# Patient Record
Sex: Female | Born: 1950 | Race: White | Hispanic: No | Smoking: Former smoker
Health system: Southern US, Community
[De-identification: ages and names within clinical notes are randomized; demographics above are authoritative.]

## PROBLEM LIST (undated history)

## (undated) DIAGNOSIS — R0602 Shortness of breath: Secondary | ICD-10-CM

## (undated) DIAGNOSIS — E7800 Pure hypercholesterolemia, unspecified: Secondary | ICD-10-CM

## (undated) DIAGNOSIS — C801 Malignant (primary) neoplasm, unspecified: Secondary | ICD-10-CM

## (undated) DIAGNOSIS — M2550 Pain in unspecified joint: Secondary | ICD-10-CM

## (undated) DIAGNOSIS — K219 Gastro-esophageal reflux disease without esophagitis: Secondary | ICD-10-CM

## (undated) DIAGNOSIS — R600 Localized edema: Secondary | ICD-10-CM

## (undated) DIAGNOSIS — I10 Essential (primary) hypertension: Secondary | ICD-10-CM

## (undated) DIAGNOSIS — R7303 Prediabetes: Secondary | ICD-10-CM

## (undated) DIAGNOSIS — E669 Obesity, unspecified: Secondary | ICD-10-CM

## (undated) DIAGNOSIS — E559 Vitamin D deficiency, unspecified: Secondary | ICD-10-CM

## (undated) HISTORY — PX: OTHER SURGICAL HISTORY: SHX169

## (undated) HISTORY — PX: BREAST BIOPSY: SHX20

## (undated) HISTORY — DX: Vitamin D deficiency, unspecified: E55.9

## (undated) HISTORY — DX: Essential (primary) hypertension: I10

## (undated) HISTORY — DX: Shortness of breath: R06.02

## (undated) HISTORY — DX: Localized edema: R60.0

## (undated) HISTORY — DX: Prediabetes: R73.03

## (undated) HISTORY — DX: Obesity, unspecified: E66.9

## (undated) HISTORY — DX: Pure hypercholesterolemia, unspecified: E78.00

## (undated) HISTORY — DX: Pain in unspecified joint: M25.50

---

## 1981-10-24 HISTORY — PX: TUBAL LIGATION: SHX77

## 2014-11-28 ENCOUNTER — Ambulatory Visit
Admission: RE | Admit: 2014-11-28 | Discharge: 2014-11-28 | Disposition: A | Discharge status: Home / Self Care | Payer: Medicare Other | Source: Ambulatory Visit | Attending: Physician Assistant | Admitting: Physician Assistant

## 2014-11-28 ENCOUNTER — Other Ambulatory Visit: Payer: Self-pay | Admitting: Physician Assistant

## 2014-11-28 DIAGNOSIS — M79672 Pain in left foot: Principal | ICD-10-CM

## 2014-11-28 DIAGNOSIS — G8929 Other chronic pain: Secondary | ICD-10-CM

## 2014-11-28 DIAGNOSIS — M79675 Pain in left toe(s): Secondary | ICD-10-CM | POA: Diagnosis not present

## 2015-03-04 DIAGNOSIS — M79672 Pain in left foot: Secondary | ICD-10-CM | POA: Diagnosis not present

## 2015-03-04 DIAGNOSIS — Z79899 Other long term (current) drug therapy: Secondary | ICD-10-CM | POA: Diagnosis not present

## 2015-03-04 DIAGNOSIS — M25569 Pain in unspecified knee: Secondary | ICD-10-CM | POA: Diagnosis not present

## 2015-03-04 DIAGNOSIS — M79673 Pain in unspecified foot: Secondary | ICD-10-CM | POA: Diagnosis not present

## 2015-03-04 DIAGNOSIS — M25561 Pain in right knee: Secondary | ICD-10-CM | POA: Diagnosis not present

## 2015-03-04 DIAGNOSIS — L608 Other nail disorders: Secondary | ICD-10-CM | POA: Diagnosis not present

## 2015-04-01 DIAGNOSIS — H66002 Acute suppurative otitis media without spontaneous rupture of ear drum, left ear: Secondary | ICD-10-CM | POA: Diagnosis not present

## 2015-05-05 DIAGNOSIS — Z23 Encounter for immunization: Secondary | ICD-10-CM | POA: Diagnosis not present

## 2015-05-05 DIAGNOSIS — Z0000 Encounter for general adult medical examination without abnormal findings: Secondary | ICD-10-CM | POA: Diagnosis not present

## 2015-05-05 DIAGNOSIS — Z136 Encounter for screening for cardiovascular disorders: Secondary | ICD-10-CM | POA: Diagnosis not present

## 2015-05-08 ENCOUNTER — Other Ambulatory Visit: Payer: Self-pay | Admitting: Family Medicine

## 2015-06-03 ENCOUNTER — Other Ambulatory Visit: Payer: Self-pay

## 2015-06-03 DIAGNOSIS — Z1231 Encounter for screening mammogram for malignant neoplasm of breast: Secondary | ICD-10-CM

## 2015-06-10 ENCOUNTER — Ambulatory Visit
Admission: RE | Admit: 2015-06-10 | Discharge: 2015-06-10 | Disposition: A | Discharge status: Home / Self Care | Payer: Medicare Other | Source: Ambulatory Visit

## 2015-06-10 DIAGNOSIS — Z1231 Encounter for screening mammogram for malignant neoplasm of breast: Secondary | ICD-10-CM | POA: Diagnosis not present

## 2015-06-12 DIAGNOSIS — R262 Difficulty in walking, not elsewhere classified: Secondary | ICD-10-CM | POA: Diagnosis not present

## 2015-06-12 DIAGNOSIS — M170 Bilateral primary osteoarthritis of knee: Secondary | ICD-10-CM | POA: Diagnosis not present

## 2015-06-15 DIAGNOSIS — K219 Gastro-esophageal reflux disease without esophagitis: Secondary | ICD-10-CM | POA: Diagnosis not present

## 2015-06-15 DIAGNOSIS — R1310 Dysphagia, unspecified: Secondary | ICD-10-CM | POA: Diagnosis not present

## 2015-06-15 DIAGNOSIS — Z86010 Personal history of colonic polyps: Secondary | ICD-10-CM | POA: Diagnosis not present

## 2015-06-15 DIAGNOSIS — K649 Unspecified hemorrhoids: Secondary | ICD-10-CM | POA: Diagnosis not present

## 2015-06-16 DIAGNOSIS — M722 Plantar fascial fibromatosis: Secondary | ICD-10-CM | POA: Diagnosis not present

## 2015-06-16 DIAGNOSIS — M79672 Pain in left foot: Secondary | ICD-10-CM | POA: Diagnosis not present

## 2015-06-16 DIAGNOSIS — M79671 Pain in right foot: Secondary | ICD-10-CM | POA: Diagnosis not present

## 2015-07-02 DIAGNOSIS — K115 Sialolithiasis: Secondary | ICD-10-CM | POA: Diagnosis not present

## 2015-07-07 DIAGNOSIS — M25572 Pain in left ankle and joints of left foot: Secondary | ICD-10-CM | POA: Diagnosis not present

## 2015-07-07 DIAGNOSIS — S40019A Contusion of unspecified shoulder, initial encounter: Secondary | ICD-10-CM | POA: Diagnosis not present

## 2015-07-07 DIAGNOSIS — W1830XA Fall on same level, unspecified, initial encounter: Secondary | ICD-10-CM | POA: Diagnosis not present

## 2015-07-07 DIAGNOSIS — M25511 Pain in right shoulder: Secondary | ICD-10-CM | POA: Diagnosis not present

## 2015-07-13 DIAGNOSIS — Z09 Encounter for follow-up examination after completed treatment for conditions other than malignant neoplasm: Secondary | ICD-10-CM | POA: Diagnosis not present

## 2015-07-13 DIAGNOSIS — K644 Residual hemorrhoidal skin tags: Secondary | ICD-10-CM | POA: Diagnosis not present

## 2015-07-13 DIAGNOSIS — K449 Diaphragmatic hernia without obstruction or gangrene: Secondary | ICD-10-CM | POA: Diagnosis not present

## 2015-07-13 DIAGNOSIS — K317 Polyp of stomach and duodenum: Secondary | ICD-10-CM | POA: Diagnosis not present

## 2015-07-13 DIAGNOSIS — R12 Heartburn: Secondary | ICD-10-CM | POA: Diagnosis not present

## 2015-07-13 DIAGNOSIS — R1310 Dysphagia, unspecified: Secondary | ICD-10-CM | POA: Diagnosis not present

## 2015-07-13 DIAGNOSIS — Z86010 Personal history of colonic polyps: Secondary | ICD-10-CM | POA: Diagnosis not present

## 2015-07-13 DIAGNOSIS — K5730 Diverticulosis of large intestine without perforation or abscess without bleeding: Secondary | ICD-10-CM | POA: Diagnosis not present

## 2015-07-13 DIAGNOSIS — K648 Other hemorrhoids: Secondary | ICD-10-CM | POA: Diagnosis not present

## 2015-07-14 DIAGNOSIS — S46011A Strain of muscle(s) and tendon(s) of the rotator cuff of right shoulder, initial encounter: Secondary | ICD-10-CM | POA: Diagnosis not present

## 2015-08-04 DIAGNOSIS — R6884 Jaw pain: Secondary | ICD-10-CM | POA: Diagnosis not present

## 2015-08-04 DIAGNOSIS — K115 Sialolithiasis: Secondary | ICD-10-CM | POA: Diagnosis not present

## 2015-08-23 DIAGNOSIS — Z23 Encounter for immunization: Secondary | ICD-10-CM | POA: Diagnosis not present

## 2015-09-17 DIAGNOSIS — R030 Elevated blood-pressure reading, without diagnosis of hypertension: Secondary | ICD-10-CM | POA: Diagnosis not present

## 2015-09-17 DIAGNOSIS — J358 Other chronic diseases of tonsils and adenoids: Secondary | ICD-10-CM | POA: Diagnosis not present

## 2015-09-17 DIAGNOSIS — J069 Acute upper respiratory infection, unspecified: Secondary | ICD-10-CM | POA: Diagnosis not present

## 2015-09-17 DIAGNOSIS — J029 Acute pharyngitis, unspecified: Secondary | ICD-10-CM | POA: Diagnosis not present

## 2015-09-28 ENCOUNTER — Other Ambulatory Visit: Payer: Self-pay | Admitting: Sports Medicine

## 2015-09-28 DIAGNOSIS — S46011D Strain of muscle(s) and tendon(s) of the rotator cuff of right shoulder, subsequent encounter: Secondary | ICD-10-CM | POA: Diagnosis not present

## 2015-09-28 DIAGNOSIS — M25511 Pain in right shoulder: Secondary | ICD-10-CM

## 2015-10-05 ENCOUNTER — Ambulatory Visit
Admission: RE | Admit: 2015-10-05 | Discharge: 2015-10-05 | Disposition: A | Discharge status: Home / Self Care | Payer: Medicare Other | Source: Ambulatory Visit | Attending: Sports Medicine | Admitting: Sports Medicine

## 2015-10-05 DIAGNOSIS — M25511 Pain in right shoulder: Secondary | ICD-10-CM | POA: Diagnosis not present

## 2015-10-07 DIAGNOSIS — S46011D Strain of muscle(s) and tendon(s) of the rotator cuff of right shoulder, subsequent encounter: Secondary | ICD-10-CM | POA: Diagnosis not present

## 2015-10-20 DIAGNOSIS — M25511 Pain in right shoulder: Secondary | ICD-10-CM | POA: Diagnosis not present

## 2015-10-20 DIAGNOSIS — M75111 Incomplete rotator cuff tear or rupture of right shoulder, not specified as traumatic: Secondary | ICD-10-CM | POA: Diagnosis not present

## 2015-10-20 DIAGNOSIS — M25611 Stiffness of right shoulder, not elsewhere classified: Secondary | ICD-10-CM | POA: Diagnosis not present

## 2015-10-21 DIAGNOSIS — M25611 Stiffness of right shoulder, not elsewhere classified: Secondary | ICD-10-CM | POA: Diagnosis not present

## 2015-10-21 DIAGNOSIS — M75111 Incomplete rotator cuff tear or rupture of right shoulder, not specified as traumatic: Secondary | ICD-10-CM | POA: Diagnosis not present

## 2015-10-21 DIAGNOSIS — M25511 Pain in right shoulder: Secondary | ICD-10-CM | POA: Diagnosis not present

## 2015-10-29 DIAGNOSIS — M25611 Stiffness of right shoulder, not elsewhere classified: Secondary | ICD-10-CM | POA: Diagnosis not present

## 2015-10-29 DIAGNOSIS — M25511 Pain in right shoulder: Secondary | ICD-10-CM | POA: Diagnosis not present

## 2015-10-29 DIAGNOSIS — M75111 Incomplete rotator cuff tear or rupture of right shoulder, not specified as traumatic: Secondary | ICD-10-CM | POA: Diagnosis not present

## 2015-11-05 DIAGNOSIS — M75111 Incomplete rotator cuff tear or rupture of right shoulder, not specified as traumatic: Secondary | ICD-10-CM | POA: Diagnosis not present

## 2015-11-05 DIAGNOSIS — M25611 Stiffness of right shoulder, not elsewhere classified: Secondary | ICD-10-CM | POA: Diagnosis not present

## 2015-11-05 DIAGNOSIS — M25511 Pain in right shoulder: Secondary | ICD-10-CM | POA: Diagnosis not present

## 2015-12-08 DIAGNOSIS — S9031XA Contusion of right foot, initial encounter: Secondary | ICD-10-CM | POA: Diagnosis not present

## 2015-12-08 DIAGNOSIS — M1711 Unilateral primary osteoarthritis, right knee: Secondary | ICD-10-CM | POA: Diagnosis not present

## 2015-12-14 ENCOUNTER — Encounter: Payer: Self-pay | Admitting: Podiatry

## 2015-12-14 ENCOUNTER — Ambulatory Visit (INDEPENDENT_AMBULATORY_CARE_PROVIDER_SITE_OTHER): Payer: Medicare Other | Admitting: Podiatry

## 2015-12-14 ENCOUNTER — Ambulatory Visit (INDEPENDENT_AMBULATORY_CARE_PROVIDER_SITE_OTHER): Payer: Medicare Other

## 2015-12-14 ENCOUNTER — Ambulatory Visit: Payer: Medicare Other

## 2015-12-14 VITALS — BP 130/78 | HR 78 | Resp 16 | Ht 63.0 in | Wt 185.0 lb

## 2015-12-14 DIAGNOSIS — M79672 Pain in left foot: Secondary | ICD-10-CM | POA: Diagnosis not present

## 2015-12-14 DIAGNOSIS — M722 Plantar fascial fibromatosis: Secondary | ICD-10-CM

## 2015-12-14 DIAGNOSIS — M779 Enthesopathy, unspecified: Secondary | ICD-10-CM | POA: Diagnosis not present

## 2015-12-14 MED ORDER — TRIAMCINOLONE ACETONIDE 10 MG/ML IJ SUSP
10.0000 mg | Freq: Once | INTRAMUSCULAR | Status: AC
Start: 2015-12-14 — End: 2015-12-14
  Administered 2015-12-14: 10 mg

## 2015-12-14 NOTE — Patient Instructions (Signed)

## 2015-12-14 NOTE — Progress Notes (Signed)
   Subjective:    Patient ID: Katrina Evans, female    DOB: [birth year], 65 y.o.   MRN: [id number]  HPI Patient presents with bilateral foot pain. Left foot; heel; Right foot-plantar forefoot & dorsal; pt stated, "Dropped a hand weight on top of foot a month ago".  Pt also stated, "Meloxicam is helping with their foot pain".   Review of Systems  HENT: Positive for sinus pressure.   All other systems reviewed and are negative.      Objective:   Physical Exam        Assessment & Plan:

## 2015-12-15 NOTE — Progress Notes (Signed)
Subjective:     Patient ID: Katrina Evans, female   DOB: 1950-11-21, 65 y.o.   MRN: [id number]  HPI patient states she's had pain in her left heel for around a year and a half and has also developed some discomfort in the right forefoot where she had some trauma secondary to dropping a weight on this area   Review of Systems  All other systems reviewed and are negative.      Objective:   Physical Exam  Constitutional: She is oriented to person, place, and time.  Cardiovascular: Intact distal pulses.   Musculoskeletal: Normal range of motion.  Neurological: She is oriented to person, place, and time.  Skin: Skin is warm.  Nursing note and vitals reviewed.  neurovascular status found to be intact with muscle strength adequate range of motion within normal limits with patient noted to have exquisite discomfort plantar aspect left heel at insertion and moderate forefoot pain right foot. Patient's found to have good digital perfusion and is well oriented 3     Assessment:     Acute plantar fasciitis left with forefoot trauma and possible fracture secondary to trauma    Plan:     H&P and x-rays of both feet reviewed. Today I went ahead and injected the left plantar fascia 3 mg Kenalog 5 mill grams Xylocaine and advised on physical therapy and dispensed fascial brace. The right foot ice therapy will be utilized  X-ray report indicated that there is no sign of fracture of the right forefoot and the left heel shows small spur with no indication of stress fracture

## 2015-12-21 ENCOUNTER — Encounter: Payer: Self-pay | Admitting: Podiatry

## 2015-12-21 ENCOUNTER — Ambulatory Visit (INDEPENDENT_AMBULATORY_CARE_PROVIDER_SITE_OTHER): Payer: Medicare Other | Admitting: Podiatry

## 2015-12-21 DIAGNOSIS — M722 Plantar fascial fibromatosis: Secondary | ICD-10-CM | POA: Diagnosis not present

## 2015-12-21 DIAGNOSIS — M779 Enthesopathy, unspecified: Secondary | ICD-10-CM

## 2015-12-21 DIAGNOSIS — M79672 Pain in left foot: Secondary | ICD-10-CM | POA: Diagnosis not present

## 2015-12-22 NOTE — Progress Notes (Signed)
Subjective:     Patient ID: Katrina Evans, female   DOB: 1951-01-16, 65 y.o.   MRN: [id number]  HPI patient states my feet feel much better but I do like to be active   Review of Systems     Objective:   Physical Exam Neurovascular status intact muscle strength adequate patient's found to have discomfort in the plantar aspect of the left heel that's improved but still present with depression of the arch noted    Assessment:     Plantar fasciitis left with inflammation fluid buildup with depression of the arch noted    Plan:     Physical therapy and shoe gear modifications recommended and scanned for custom orthotics to reduce all pressure on the plantar arch

## 2016-01-12 ENCOUNTER — Ambulatory Visit: Payer: Medicare Other | Admitting: *Deleted

## 2016-01-12 DIAGNOSIS — M722 Plantar fascial fibromatosis: Secondary | ICD-10-CM

## 2016-01-12 NOTE — Patient Instructions (Signed)

## 2016-01-12 NOTE — Progress Notes (Signed)
Patient ID: Katrina Evans, female   DOB: 1951-02-27, 65 y.o.   MRN: [id number] Patient presents for orthotic pick up.  Verbal and written break in and wear instructions given.  Patient will follow up in 4 weeks if symptoms worsen or fail to improve.

## 2016-01-13 DIAGNOSIS — M25562 Pain in left knee: Secondary | ICD-10-CM | POA: Diagnosis not present

## 2016-01-13 DIAGNOSIS — M170 Bilateral primary osteoarthritis of knee: Secondary | ICD-10-CM | POA: Diagnosis not present

## 2016-01-13 DIAGNOSIS — M25561 Pain in right knee: Secondary | ICD-10-CM | POA: Diagnosis not present

## 2016-01-13 DIAGNOSIS — R262 Difficulty in walking, not elsewhere classified: Secondary | ICD-10-CM | POA: Diagnosis not present

## 2016-01-19 DIAGNOSIS — M1712 Unilateral primary osteoarthritis, left knee: Secondary | ICD-10-CM | POA: Diagnosis not present

## 2016-01-19 DIAGNOSIS — M170 Bilateral primary osteoarthritis of knee: Secondary | ICD-10-CM | POA: Diagnosis not present

## 2016-01-19 DIAGNOSIS — M25562 Pain in left knee: Secondary | ICD-10-CM | POA: Diagnosis not present

## 2016-01-21 DIAGNOSIS — M25561 Pain in right knee: Secondary | ICD-10-CM | POA: Diagnosis not present

## 2016-01-21 DIAGNOSIS — M1711 Unilateral primary osteoarthritis, right knee: Secondary | ICD-10-CM | POA: Diagnosis not present

## 2016-01-26 DIAGNOSIS — M25562 Pain in left knee: Secondary | ICD-10-CM | POA: Diagnosis not present

## 2016-01-26 DIAGNOSIS — M1712 Unilateral primary osteoarthritis, left knee: Secondary | ICD-10-CM | POA: Diagnosis not present

## 2016-01-28 DIAGNOSIS — M25561 Pain in right knee: Secondary | ICD-10-CM | POA: Diagnosis not present

## 2016-01-28 DIAGNOSIS — M1711 Unilateral primary osteoarthritis, right knee: Secondary | ICD-10-CM | POA: Diagnosis not present

## 2016-01-29 DIAGNOSIS — L720 Epidermal cyst: Secondary | ICD-10-CM | POA: Diagnosis not present

## 2016-01-29 DIAGNOSIS — L821 Other seborrheic keratosis: Secondary | ICD-10-CM | POA: Diagnosis not present

## 2016-01-29 DIAGNOSIS — D485 Neoplasm of uncertain behavior of skin: Secondary | ICD-10-CM | POA: Diagnosis not present

## 2016-01-29 DIAGNOSIS — D1801 Hemangioma of skin and subcutaneous tissue: Secondary | ICD-10-CM | POA: Diagnosis not present

## 2016-01-29 DIAGNOSIS — L814 Other melanin hyperpigmentation: Secondary | ICD-10-CM | POA: Diagnosis not present

## 2016-01-29 DIAGNOSIS — L301 Dyshidrosis [pompholyx]: Secondary | ICD-10-CM | POA: Diagnosis not present

## 2016-01-29 DIAGNOSIS — D2239 Melanocytic nevi of other parts of face: Secondary | ICD-10-CM | POA: Diagnosis not present

## 2016-02-01 DIAGNOSIS — M170 Bilateral primary osteoarthritis of knee: Secondary | ICD-10-CM | POA: Diagnosis not present

## 2016-02-01 DIAGNOSIS — M25562 Pain in left knee: Secondary | ICD-10-CM | POA: Diagnosis not present

## 2016-02-01 DIAGNOSIS — R2689 Other abnormalities of gait and mobility: Secondary | ICD-10-CM | POA: Diagnosis not present

## 2016-02-01 DIAGNOSIS — M25561 Pain in right knee: Secondary | ICD-10-CM | POA: Diagnosis not present

## 2016-02-02 DIAGNOSIS — M1712 Unilateral primary osteoarthritis, left knee: Secondary | ICD-10-CM | POA: Diagnosis not present

## 2016-02-02 DIAGNOSIS — M25562 Pain in left knee: Secondary | ICD-10-CM | POA: Diagnosis not present

## 2016-02-04 DIAGNOSIS — M25561 Pain in right knee: Secondary | ICD-10-CM | POA: Diagnosis not present

## 2016-02-04 DIAGNOSIS — M1711 Unilateral primary osteoarthritis, right knee: Secondary | ICD-10-CM | POA: Diagnosis not present

## 2016-02-09 DIAGNOSIS — M170 Bilateral primary osteoarthritis of knee: Secondary | ICD-10-CM | POA: Diagnosis not present

## 2016-02-09 DIAGNOSIS — M25561 Pain in right knee: Secondary | ICD-10-CM | POA: Diagnosis not present

## 2016-02-09 DIAGNOSIS — M25562 Pain in left knee: Secondary | ICD-10-CM | POA: Diagnosis not present

## 2016-02-18 ENCOUNTER — Telehealth: Payer: Self-pay | Admitting: *Deleted

## 2016-02-18 NOTE — Telephone Encounter (Signed)
I received a call from Katrina Evans this morning thanking me for pointing out a bruise I saw on the bottom of her foot during her orthotic fitting.  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  She stated if I had not mentioned it she would have never known it was there.  She states that after its removal she should be just fine and just wanted to thank me.

## 2016-02-24 DIAGNOSIS — D0371 Melanoma in situ of right lower limb, including hip: Secondary | ICD-10-CM | POA: Diagnosis not present

## 2016-03-24 DIAGNOSIS — R030 Elevated blood-pressure reading, without diagnosis of hypertension: Secondary | ICD-10-CM | POA: Diagnosis not present

## 2016-03-24 DIAGNOSIS — J358 Other chronic diseases of tonsils and adenoids: Secondary | ICD-10-CM | POA: Diagnosis not present

## 2016-05-06 DIAGNOSIS — Z131 Encounter for screening for diabetes mellitus: Secondary | ICD-10-CM | POA: Diagnosis not present

## 2016-05-06 DIAGNOSIS — W19XXXA Unspecified fall, initial encounter: Secondary | ICD-10-CM | POA: Diagnosis not present

## 2016-05-06 DIAGNOSIS — Z6834 Body mass index (BMI) 34.0-34.9, adult: Secondary | ICD-10-CM | POA: Diagnosis not present

## 2016-05-06 DIAGNOSIS — R8290 Unspecified abnormal findings in urine: Secondary | ICD-10-CM | POA: Diagnosis not present

## 2016-05-06 DIAGNOSIS — Z0000 Encounter for general adult medical examination without abnormal findings: Secondary | ICD-10-CM | POA: Diagnosis not present

## 2016-05-06 DIAGNOSIS — E7800 Pure hypercholesterolemia, unspecified: Secondary | ICD-10-CM | POA: Diagnosis not present

## 2016-05-09 ENCOUNTER — Other Ambulatory Visit: Payer: Self-pay | Admitting: Physician Assistant

## 2016-05-09 DIAGNOSIS — Z139 Encounter for screening, unspecified: Secondary | ICD-10-CM

## 2016-05-11 DIAGNOSIS — M8588 Other specified disorders of bone density and structure, other site: Secondary | ICD-10-CM | POA: Diagnosis not present

## 2016-05-11 DIAGNOSIS — M8589 Other specified disorders of bone density and structure, multiple sites: Secondary | ICD-10-CM | POA: Diagnosis not present

## 2016-06-01 DIAGNOSIS — H2513 Age-related nuclear cataract, bilateral: Secondary | ICD-10-CM | POA: Diagnosis not present

## 2016-06-13 ENCOUNTER — Ambulatory Visit: Payer: Medicare Other

## 2016-06-13 DIAGNOSIS — L438 Other lichen planus: Secondary | ICD-10-CM | POA: Diagnosis not present

## 2016-06-13 DIAGNOSIS — D1801 Hemangioma of skin and subcutaneous tissue: Secondary | ICD-10-CM | POA: Diagnosis not present

## 2016-06-13 DIAGNOSIS — L821 Other seborrheic keratosis: Secondary | ICD-10-CM | POA: Diagnosis not present

## 2016-06-13 DIAGNOSIS — L814 Other melanin hyperpigmentation: Secondary | ICD-10-CM | POA: Diagnosis not present

## 2016-06-13 DIAGNOSIS — D225 Melanocytic nevi of trunk: Secondary | ICD-10-CM | POA: Diagnosis not present

## 2016-06-21 ENCOUNTER — Ambulatory Visit
Admission: RE | Admit: 2016-06-21 | Discharge: 2016-06-21 | Disposition: A | Discharge status: Home / Self Care | Payer: Medicare Other | Source: Ambulatory Visit | Attending: Physician Assistant | Admitting: Physician Assistant

## 2016-06-21 DIAGNOSIS — Z139 Encounter for screening, unspecified: Secondary | ICD-10-CM

## 2016-06-21 DIAGNOSIS — Z1231 Encounter for screening mammogram for malignant neoplasm of breast: Secondary | ICD-10-CM | POA: Diagnosis not present

## 2016-08-09 DIAGNOSIS — Z23 Encounter for immunization: Secondary | ICD-10-CM | POA: Diagnosis not present

## 2016-08-30 DIAGNOSIS — M25562 Pain in left knee: Secondary | ICD-10-CM | POA: Diagnosis not present

## 2016-08-30 DIAGNOSIS — M170 Bilateral primary osteoarthritis of knee: Secondary | ICD-10-CM | POA: Diagnosis not present

## 2016-08-30 DIAGNOSIS — M25561 Pain in right knee: Secondary | ICD-10-CM | POA: Diagnosis not present

## 2016-09-02 ENCOUNTER — Encounter: Payer: Self-pay | Admitting: Podiatry

## 2016-09-02 ENCOUNTER — Ambulatory Visit (INDEPENDENT_AMBULATORY_CARE_PROVIDER_SITE_OTHER): Payer: Medicare Other

## 2016-09-02 ENCOUNTER — Ambulatory Visit (INDEPENDENT_AMBULATORY_CARE_PROVIDER_SITE_OTHER): Payer: Medicare Other | Admitting: Podiatry

## 2016-09-02 VITALS — BP 144/86 | HR 71 | Resp 16

## 2016-09-02 DIAGNOSIS — M722 Plantar fascial fibromatosis: Secondary | ICD-10-CM

## 2016-09-02 DIAGNOSIS — M79671 Pain in right foot: Secondary | ICD-10-CM

## 2016-09-02 DIAGNOSIS — M779 Enthesopathy, unspecified: Secondary | ICD-10-CM

## 2016-09-02 MED ORDER — TRIAMCINOLONE ACETONIDE 10 MG/ML IJ SUSP
10.0000 mg | Freq: Once | INTRAMUSCULAR | Status: AC
  Administered 2016-09-02: 10 mg

## 2016-09-02 NOTE — Patient Instructions (Signed)

## 2016-09-02 NOTE — Progress Notes (Signed)
Subjective:     Patient ID: Katrina Evans, female   DOB: 13-May-1951, 65 y.o.   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 and they put a rapid on ever since her forefoot has been hurting her   Review of Systems     Objective:   Physical Exam Neurovascular status intact muscle strength adequate with inflammation in the subsecond metatarsal right with fluid buildup within the joint surface    Assessment:     Probable inflammatory capsulitis second MPJ right with pain    Plan:     H&P condition reviewed and went ahead did proximal nerve block aspirated the joint getting out a small amount of clear fluid and injected with a quarter cc deck Smith some Kenalog and applied thick plantar pad to take pressure off the joint surface. Reappoint 3 weeks or earlier if needed and will not go barefoot at home

## 2016-09-23 ENCOUNTER — Ambulatory Visit: Payer: Medicare Other | Admitting: Podiatry

## 2016-11-04 DIAGNOSIS — J208 Acute bronchitis due to other specified organisms: Secondary | ICD-10-CM | POA: Diagnosis not present

## 2016-11-04 DIAGNOSIS — R69 Illness, unspecified: Secondary | ICD-10-CM | POA: Diagnosis not present

## 2016-11-04 DIAGNOSIS — R509 Fever, unspecified: Secondary | ICD-10-CM | POA: Diagnosis not present

## 2016-11-04 DIAGNOSIS — R05 Cough: Secondary | ICD-10-CM | POA: Diagnosis not present

## 2016-12-19 DIAGNOSIS — L821 Other seborrheic keratosis: Secondary | ICD-10-CM | POA: Diagnosis not present

## 2016-12-19 DIAGNOSIS — I788 Other diseases of capillaries: Secondary | ICD-10-CM | POA: Diagnosis not present

## 2016-12-19 DIAGNOSIS — D1801 Hemangioma of skin and subcutaneous tissue: Secondary | ICD-10-CM | POA: Diagnosis not present

## 2016-12-19 DIAGNOSIS — L918 Other hypertrophic disorders of the skin: Secondary | ICD-10-CM | POA: Diagnosis not present

## 2016-12-19 DIAGNOSIS — Z85820 Personal history of malignant melanoma of skin: Secondary | ICD-10-CM | POA: Diagnosis not present

## 2017-01-13 DIAGNOSIS — K649 Unspecified hemorrhoids: Secondary | ICD-10-CM | POA: Diagnosis not present

## 2017-01-30 ENCOUNTER — Ambulatory Visit (INDEPENDENT_AMBULATORY_CARE_PROVIDER_SITE_OTHER): Payer: Medicare Other | Admitting: Podiatry

## 2017-01-30 DIAGNOSIS — M779 Enthesopathy, unspecified: Secondary | ICD-10-CM | POA: Diagnosis not present

## 2017-01-30 DIAGNOSIS — M722 Plantar fascial fibromatosis: Secondary | ICD-10-CM

## 2017-01-30 MED ORDER — TRIAMCINOLONE ACETONIDE 10 MG/ML IJ SUSP
10.0000 mg | Freq: Once | INTRAMUSCULAR | Status: AC
  Administered 2017-01-30: 10 mg

## 2017-01-30 NOTE — Progress Notes (Signed)
Subjective:     Patient ID: Katrina Evans, female   DOB: 12-Jul-1951, 66 y.o.   MRN: [id number]  HPI patient states the forefoot is doing well with minimal discomfort and patient is noted to have discomfort in the right plantar heel that B has become inflamed recently   Review of Systems     Objective:   Physical Exam Neurovascular status was found to be intact with a history of melanoma right which seems to be doing well and is checked every 3 months. The right heel is very tender at the medial insertion of the calcaneus with fluid buildup noted with mild depression of the arch and no forefoot capsulitis symptoms are noted currently    Assessment:     Acute plantar fasciitis right with inflammation of the metatarsal phalangeal joints which is currently improved    Plan:     H&P conditions reviewed and injected the plantar fascial right 3 mg Kenalog 5 mill grams Xylocaine and discussed stable like shoes and padding as needed for the forefoot. Applied fascial brace with instructions on usage and reappoint 2 weeks to reevaluate

## 2017-02-15 ENCOUNTER — Ambulatory Visit (INDEPENDENT_AMBULATORY_CARE_PROVIDER_SITE_OTHER): Payer: Medicare Other | Admitting: Podiatry

## 2017-02-15 DIAGNOSIS — M722 Plantar fascial fibromatosis: Secondary | ICD-10-CM

## 2017-02-15 MED ORDER — TRIAMCINOLONE ACETONIDE 10 MG/ML IJ SUSP
10.0000 mg | Freq: Once | INTRAMUSCULAR | Status: AC
  Administered 2017-02-15: 10 mg

## 2017-02-15 NOTE — Progress Notes (Signed)
Subjective:    Patient ID: Katrina Evans, female   DOB: 66 y.o.   MRN: [id number]   HPI patient presents stating the right heel is extremely sore and she's having trouble bearing weight on it. States it's been worse in the center and outside the inside seems better that we worked on    ROS      Objective:  Physical Exam Neurovascular status intact with discomfort in the center of the right heel and into the outer band of the right plantar fascia with fluid buildup at the inside band quite a bit better    Assessment:     Acute plantar fasciitis right with inflammation fluid around the medial band    Plan:    Reviewed case and at this time I did a lateral injection of the plantar fascia 3 mg Kenalog 5 mg Xylocaine and I then dispensed a short air fracture walker to completely immobilize and reduce stress against the plantar heel. Patient will be seen back to recheck in 3 weeks or earlier if needed

## 2017-03-15 ENCOUNTER — Ambulatory Visit (INDEPENDENT_AMBULATORY_CARE_PROVIDER_SITE_OTHER): Payer: Medicare Other | Admitting: Podiatry

## 2017-03-15 ENCOUNTER — Encounter: Payer: Self-pay | Admitting: Podiatry

## 2017-03-15 DIAGNOSIS — M722 Plantar fascial fibromatosis: Secondary | ICD-10-CM | POA: Diagnosis not present

## 2017-03-18 NOTE — Progress Notes (Signed)
Subjective:    Patient ID: Katrina Evans, female   DOB: 66 y.o.   MRN: [id number]   HPI patient presents stating that she's doing okay but still having a lot of pain in her heel and it's worse when she gets up in the morning and after periods of sitting    ROS      Objective:  Physical Exam Neurovascular status intact with patient found to have quite a bit of discomfort in the plantar aspect of the left heel still present with inflammation noted    Assessment:   Continuation of plantar fasciitis left with pain      Plan:    H&P discussed the importance of not going barefoot and wearing good support and dispensed a night splint with all instructions on usage at this time

## 2017-03-29 ENCOUNTER — Ambulatory Visit (INDEPENDENT_AMBULATORY_CARE_PROVIDER_SITE_OTHER): Payer: Medicare Other | Admitting: Podiatry

## 2017-03-29 ENCOUNTER — Encounter: Payer: Self-pay | Admitting: Podiatry

## 2017-03-29 DIAGNOSIS — M722 Plantar fascial fibromatosis: Secondary | ICD-10-CM

## 2017-03-29 MED ORDER — TRIAMCINOLONE ACETONIDE 10 MG/ML IJ SUSP
10.0000 mg | Freq: Once | INTRAMUSCULAR | Status: AC
  Administered 2017-03-29: 10 mg

## 2017-03-31 NOTE — Progress Notes (Signed)
Subjective:    Patient ID: Katrina Evans, female   DOB: 66 y.o.   MRN: [id number]   HPI patient states she's doing some better but is getting ready to go to [place]    ROS      Objective:  Physical Exam neurovascular status with patient found to have discomfort in the right heel that's improved but still present   Assessment:    Continuation of plantar fasciitis right     Plan:    At this time with her leaving for [place] I did go ahead and I reinjected the fascia 3 Mill grams Kenalog 5 g Xylocaine and then discussed the possibilities for shockwave therapy if symptoms persist. Reappoint to recheck again in the next few weeks when she returns from treatment

## 2017-04-12 ENCOUNTER — Encounter: Payer: Self-pay | Admitting: Podiatry

## 2017-04-12 ENCOUNTER — Ambulatory Visit (INDEPENDENT_AMBULATORY_CARE_PROVIDER_SITE_OTHER): Payer: Medicare Other | Admitting: Podiatry

## 2017-04-12 DIAGNOSIS — M722 Plantar fascial fibromatosis: Secondary | ICD-10-CM

## 2017-04-13 NOTE — Progress Notes (Signed)
Subjective:    Patient ID: Katrina Evans, female   DOB: 66 y.o.   MRN: [id number]   HPI patient states I'm doing pretty well with the plantar fasciitis with mild discomfort still noted and is developed some mild swelling right foot secondary to being in [place] and doing a lot of walking    ROS      Objective:  Physical Exam neurovascular status intact negative Homans sign was noted with mild edema in the forefoot right extending into the ankle and mild discomfort plantar fascial     Assessment:   Plantar fasciitis still present but improved with edema      Plan:    H&P conditions reviewed and at this point I dispensed anklet right instructed on elevation compression and patient be seen back for [place] to recheck again in the next 4 weeks or earlier if needed

## 2017-05-10 DIAGNOSIS — S92514A Nondisplaced fracture of proximal phalanx of right lesser toe(s), initial encounter for closed fracture: Secondary | ICD-10-CM | POA: Diagnosis not present

## 2017-05-17 DIAGNOSIS — E7800 Pure hypercholesterolemia, unspecified: Secondary | ICD-10-CM | POA: Diagnosis not present

## 2017-05-17 DIAGNOSIS — M8588 Other specified disorders of bone density and structure, other site: Secondary | ICD-10-CM | POA: Diagnosis not present

## 2017-05-17 DIAGNOSIS — Z0000 Encounter for general adult medical examination without abnormal findings: Secondary | ICD-10-CM | POA: Diagnosis not present

## 2017-05-17 DIAGNOSIS — R002 Palpitations: Secondary | ICD-10-CM | POA: Diagnosis not present

## 2017-05-17 DIAGNOSIS — Z131 Encounter for screening for diabetes mellitus: Secondary | ICD-10-CM | POA: Diagnosis not present

## 2017-06-20 DIAGNOSIS — D2261 Melanocytic nevi of right upper limb, including shoulder: Secondary | ICD-10-CM | POA: Diagnosis not present

## 2017-06-20 DIAGNOSIS — D1801 Hemangioma of skin and subcutaneous tissue: Secondary | ICD-10-CM | POA: Diagnosis not present

## 2017-06-20 DIAGNOSIS — L821 Other seborrheic keratosis: Secondary | ICD-10-CM | POA: Diagnosis not present

## 2017-06-20 DIAGNOSIS — Z85820 Personal history of malignant melanoma of skin: Secondary | ICD-10-CM | POA: Diagnosis not present

## 2017-06-20 DIAGNOSIS — L918 Other hypertrophic disorders of the skin: Secondary | ICD-10-CM | POA: Diagnosis not present

## 2017-06-28 ENCOUNTER — Encounter: Payer: Self-pay | Admitting: Podiatry

## 2017-06-28 ENCOUNTER — Ambulatory Visit (INDEPENDENT_AMBULATORY_CARE_PROVIDER_SITE_OTHER): Payer: Medicare Other | Admitting: Podiatry

## 2017-06-28 ENCOUNTER — Ambulatory Visit (INDEPENDENT_AMBULATORY_CARE_PROVIDER_SITE_OTHER): Payer: Medicare Other

## 2017-06-28 DIAGNOSIS — S92501A Displaced unspecified fracture of right lesser toe(s), initial encounter for closed fracture: Secondary | ICD-10-CM | POA: Diagnosis not present

## 2017-06-28 DIAGNOSIS — M722 Plantar fascial fibromatosis: Secondary | ICD-10-CM | POA: Diagnosis not present

## 2017-06-28 MED ORDER — TRIAMCINOLONE ACETONIDE 10 MG/ML IJ SUSP
10.0000 mg | Freq: Once | INTRAMUSCULAR | Status: AC
  Administered 2017-06-28: 10 mg

## 2017-06-29 NOTE — Progress Notes (Signed)
Subjective:    Patient ID: Katrina Evans, female   DOB: 66 y.o.   MRN: [id number]   HPI patient presents with 2 separate problems with one being a swelling of the right fifth toe with probable fracture and the other being inflammation of the plantar fascia right with fluid buildup of several weeks' duration    ROS      Objective:  Physical Exam neurovascular status is found to be intact with patient found to have swelling of the fifth digit right with pain when palpated and inflammation pain in the plantar heel right at the insertional point of the tendon into the calcaneus     Assessment:    Acute plantar fasciitis right with fractured fifth digit right     Plan:   H&P both conditions reviewed and for the toe I have recommended ice therapy wider shoes and I injected the plantar fascial right 3 mg Kenalog 5 mg Xylocaine and instructed on physical therapy supportive shoes and reappoint to recheck  X-ray indicates fracture of the head of the proximal phalanx fifth digit right localized with no indications of extension

## 2017-07-17 DIAGNOSIS — L821 Other seborrheic keratosis: Secondary | ICD-10-CM | POA: Diagnosis not present

## 2017-07-17 DIAGNOSIS — L918 Other hypertrophic disorders of the skin: Secondary | ICD-10-CM | POA: Diagnosis not present

## 2017-07-25 ENCOUNTER — Other Ambulatory Visit: Payer: Self-pay | Admitting: Physician Assistant

## 2017-07-25 DIAGNOSIS — Z1231 Encounter for screening mammogram for malignant neoplasm of breast: Secondary | ICD-10-CM

## 2017-08-11 ENCOUNTER — Ambulatory Visit: Payer: Medicare Other

## 2017-08-16 ENCOUNTER — Observation Stay (HOSPITAL_COMMUNITY)
Admission: EM | Admit: 2017-08-16 | Discharge: 2017-08-18 | Disposition: A | Discharge status: Home / Self Care | Payer: Medicare Other | Attending: General Surgery | Admitting: General Surgery

## 2017-08-16 ENCOUNTER — Emergency Department (HOSPITAL_COMMUNITY): Payer: Medicare Other

## 2017-08-16 ENCOUNTER — Encounter (HOSPITAL_COMMUNITY): Payer: Self-pay

## 2017-08-16 DIAGNOSIS — R109 Unspecified abdominal pain: Secondary | ICD-10-CM | POA: Diagnosis present

## 2017-08-16 DIAGNOSIS — K3580 Unspecified acute appendicitis: Secondary | ICD-10-CM | POA: Diagnosis not present

## 2017-08-16 DIAGNOSIS — J8410 Pulmonary fibrosis, unspecified: Secondary | ICD-10-CM | POA: Diagnosis not present

## 2017-08-16 DIAGNOSIS — R1031 Right lower quadrant pain: Secondary | ICD-10-CM | POA: Diagnosis not present

## 2017-08-16 DIAGNOSIS — K3589 Other acute appendicitis without perforation or gangrene: Secondary | ICD-10-CM | POA: Diagnosis not present

## 2017-08-16 DIAGNOSIS — Z87891 Personal history of nicotine dependence: Secondary | ICD-10-CM | POA: Insufficient documentation

## 2017-08-16 DIAGNOSIS — D259 Leiomyoma of uterus, unspecified: Secondary | ICD-10-CM | POA: Diagnosis not present

## 2017-08-16 LAB — URINALYSIS, ROUTINE W REFLEX MICROSCOPIC
BACTERIA UA: NONE SEEN
BILIRUBIN URINE: NEGATIVE
Glucose, UA: NEGATIVE mg/dL
KETONES UR: NEGATIVE mg/dL
Leukocytes, UA: NEGATIVE
NITRITE: NEGATIVE
Protein, ur: NEGATIVE mg/dL
Specific Gravity, Urine: 1.013 (ref 1.005–1.030)
pH: 5 (ref 5.0–8.0)

## 2017-08-16 LAB — BASIC METABOLIC PANEL
Anion gap: 12 (ref 5–15)
BUN: 17 mg/dL (ref 6–20)
CALCIUM: 9.4 mg/dL (ref 8.9–10.3)
CHLORIDE: 102 mmol/L (ref 101–111)
CO2: 24 mmol/L (ref 22–32)
CREATININE: 0.78 mg/dL (ref 0.44–1.00)
GFR calc Af Amer: >60 mL/min (ref >60)
Glucose, Bld: 112 mg/dL — ABNORMAL HIGH (ref 65–99)
Potassium: 3.6 mmol/L (ref 3.5–5.1)
SODIUM: 138 mmol/L (ref 135–145)

## 2017-08-16 LAB — CBC
HEMATOCRIT: 42.5 % (ref 36.0–46.0)
Hemoglobin: 14.5 g/dL (ref 12.0–15.0)
MCH: 31.8 pg (ref 26.0–34.0)
MCHC: 34.1 g/dL (ref 30.0–36.0)
MCV: 93.2 fL (ref 78.0–100.0)
Platelets: 191 10*3/uL (ref 150–400)
RBC: 4.56 MIL/uL (ref 3.87–5.11)
RDW: 12.9 % (ref 11.5–15.5)
WBC: 12.5 10*3/uL — AB (ref 4.0–10.5)

## 2017-08-16 MED ORDER — ONDANSETRON HCL 4 MG/2ML IJ SOLN
4.0000 mg | Freq: Once | INTRAMUSCULAR | Status: AC
  Administered 2017-08-16: 4 mg via INTRAVENOUS
  Filled 2017-08-16: qty 2

## 2017-08-16 MED ORDER — IOPAMIDOL (ISOVUE-300) INJECTION 61%
INTRAVENOUS | Status: AC
  Filled 2017-08-16: qty 100

## 2017-08-16 MED ORDER — MORPHINE SULFATE (PF) 4 MG/ML IV SOLN
4.0000 mg | Freq: Once | INTRAVENOUS | Status: AC
  Administered 2017-08-16: 4 mg via INTRAVENOUS
  Filled 2017-08-16: qty 1

## 2017-08-16 MED ORDER — SODIUM CHLORIDE 0.9 % IV BOLUS (SEPSIS)
500.0000 mL | Freq: Once | INTRAVENOUS | Status: AC
Start: 2017-08-16 — End: 2017-08-17
  Administered 2017-08-16: 500 mL via INTRAVENOUS

## 2017-08-16 MED ORDER — LORAZEPAM 2 MG/ML IJ SOLN: 0.5000 mg | Freq: Once | INTRAMUSCULAR | Status: DC | PRN

## 2017-08-16 MED ORDER — IOPAMIDOL (ISOVUE-300) INJECTION 61%
100.0000 mL | Freq: Once | INTRAVENOUS | Status: AC | PRN
  Administered 2017-08-16: 100 mL via INTRAVENOUS

## 2017-08-16 NOTE — ED Provider Notes (Signed)
[facility] Provider Note   CSN: [id number] Arrival date & time: 08/16/17  1931     History   Chief Complaint Chief Complaint  Patient presents with  . Flank Pain    HPI Katrina Evans is a 66 y.o. female.  The history is provided by the patient and medical records.  Flank Pain  Associated symptoms include abdominal pain.    66 year old female with no significant past medical history Evans to the ED for right lower quadrant abdominal pain.  Reports this awoke her from sleep around 1 AM.  She reports pain has worsened throughout the day.  She reports some associated nausea and decreased oral intake but no vomiting or diarrhea.  She denies any urinary symptoms.  Does report low-grade fever.  States she was seen by her primary care doctor who did a urine test and gave her some medicine for bladder spasms.  States she did not take the medication but pain has gradually worsened so she came here for evaluation.  History reviewed. No pertinent past medical history.  There are no active problems to display for this patient.   History reviewed. No pertinent surgical history.  OB History    No data available       Home Medications    Prior to Admission medications   Medication Sig Start Date End Date Taking? Authorizing Provider  acetaminophen (TYLENOL) 500 MG tablet Take 500 mg by mouth every 6 (six) hours as needed for moderate pain.   Yes [provider]  hyoscyamine (LEVSIN, ANASPAZ) 0.125 MG tablet Take 0.125 mg by mouth every 6 (six) hours as needed for bladder spasms.  08/16/17   [provider]    Family History History reviewed. No pertinent family history.  Social History Social History  Substance Use Topics  . Smoking status: Former Research scientist (life sciences)  . Smokeless tobacco: Never Used  . Alcohol use No     Allergies   Patient has no known allergies.   Review of Systems Review of Systems  Constitutional:  Positive for appetite change.  Gastrointestinal: Positive for abdominal pain and nausea.  All other systems reviewed and are negative.    Physical Exam Updated Vital Signs BP (!) 166/105 (BP Location: Left Arm)   Pulse (!) 111   Temp 99.2 F (37.3 C) (Oral)   Resp 18   SpO2 100%   Physical Exam  Constitutional: She is oriented to person, place, and time. She appears well-developed and well-nourished.  HENT:  Head: Normocephalic and atraumatic.  Mouth/Throat: Oropharynx is clear and moist.  Eyes: Pupils are equal, round, and reactive to light. Conjunctivae and EOM are normal.  Neck: Normal range of motion.  Cardiovascular: Normal rate, regular rhythm and normal heart sounds.   Pulmonary/Chest: Effort normal and breath sounds normal.  Abdominal: Soft. Bowel sounds are normal. There is tenderness in the right lower quadrant. There is no CVA tenderness.    Tenderness and firmness focally in RLQ, no distention, bowel sounds normal, no CVA tenderness  Musculoskeletal: Normal range of motion.  Neurological: She is alert and oriented to person, place, and time.  Skin: Skin is warm and dry.  Psychiatric: She has a normal mood and affect.  Nursing note and vitals reviewed.    ED Treatments / Results  Labs (all labs ordered are listed, but only abnormal results are displayed) Labs Reviewed  URINALYSIS, ROUTINE W REFLEX MICROSCOPIC - Abnormal; Notable for the following:       Result Value  Hgb urine dipstick MODERATE (*)    Squamous Epithelial / LPF 0-5 (*)    All other components within normal limits  BASIC METABOLIC PANEL - Abnormal; Notable for the following:    Glucose, Bld 112 (*)    All other components within normal limits  CBC - Abnormal; Notable for the following:    WBC 12.5 (*)    All other components within normal limits    EKG  EKG Interpretation None       Radiology Ct Abdomen Pelvis W Contrast  Result Date: 08/17/2017 CLINICAL DATA:  66 year old  female with right lower quadrant abdominal pain. EXAM: CT ABDOMEN AND PELVIS WITH CONTRAST TECHNIQUE: Multidetector CT imaging of the abdomen and pelvis was performed using the standard protocol following bolus administration of intravenous contrast. CONTRAST:  164mL ISOVUE-300 IOPAMIDOL (ISOVUE-300) INJECTION 61% COMPARISON:  None. FINDINGS: Lower chest: Multiple small scattered calcified granuloma at the lung bases. The visualized lung bases are otherwise clear. No intra-abdominal free air or free fluid. Hepatobiliary: No focal liver abnormality is seen. No gallstones, gallbladder wall thickening, or biliary dilatation. Pancreas: Unremarkable. No pancreatic ductal dilatation or surrounding inflammatory changes. Spleen: Normal in size without focal abnormality. Adrenals/Urinary Tract: The adrenal glands are unremarkable. Subcentimeter left renal hypodense lesion is too small to characterize but likely represents a cyst. Multiple small bilateral parapelvic cysts noted. There is no hydronephrosis on either side. The visualized ureters and urinary bladder appear unremarkable. Stomach/Bowel: There is a 15 mm duodenum diverticula at the head of the pancreas. There is no evidence of bowel obstruction. Small scattered sigmoid diverticula without active inflammatory changes noted. There is an appendicolith at the base of the appendix. The appendix is enlarged and inflamed. It is located in the right lower quadrant inferior to the cecum. No evidence of perforation or abscess. Vascular/Lymphatic: Mild atherosclerotic calcification of the abdominal aorta. No adenopathy. Reproductive: The uterus is anteverted. There is a 3.5 x 2.5 cm fundal fibroid. The ovaries are unremarkable as visualized. Other: None Musculoskeletal: Multiple vertebral body hemangioma most prominent at L2. No acute osseous pathology. IMPRESSION: 1. Acute appendicitis with an appendicolith at the base of the appendix. No evidence of perforation or abscess.  2. Diverticulosis without active inflammatory changes. 3. Uterine fibroid. Electronically Signed   By: Anner Crete M.D.   On: 08/17/2017 00:18    Procedures Procedures (including critical care time)  Medications Ordered in ED Medications  LORazepam (ATIVAN) injection 0.5 mg (not administered)  iopamidol (ISOVUE-300) 61 % injection (not administered)  cefTRIAXone (ROCEPHIN) 2 g in dextrose 5 % 50 mL IVPB (2 g Intravenous New Bag/Given 08/17/17 0118)    And  metroNIDAZOLE (FLAGYL) IVPB 500 mg (not administered)  morphine 4 MG/ML injection 4 mg (4 mg Intravenous Given 08/16/17 2259)  ondansetron (ZOFRAN) injection 4 mg (4 mg Intravenous Given 08/16/17 2300)  sodium chloride 0.9 % bolus 500 mL (0 mLs Intravenous Stopped 08/17/17 0007)  iopamidol (ISOVUE-300) 61 % injection 100 mL (100 mLs Intravenous Contrast Given 08/16/17 2354)  morphine 4 MG/ML injection 4 mg (4 mg Intravenous Given 08/17/17 0133)     Initial Impression / Assessment and Plan / ED Course  I have reviewed the triage vital signs and the nursing notes.  Pertinent labs & imaging results that were available during my care of the patient were reviewed by me and considered in my medical decision making (see chart for details).  66 year old female here with right lower quadrant abdominal pain.  Began around 1 AM, progressively  worsening.  Seen by PCP and put on medications for "bladder spasms".  She denies any urinary symptoms.  She does have low-grade fever here and mild tachycardia.  She is nontoxic in appearance.  She has focal tenderness and firmness in the right lower quadrant.  noCVA tenderness.  Screening labs obtained from triage, mild leukocytosis.  UA with moderate blood but no signs of infection.  Given her symptoms, will plan for CT scan with contrast for further evaluation.  IV fluids and pain medication ordered.  12:28 AM CT scan shows acute appendicitis with appendicolith.  No abscess or perforation.  Results  discussed with patient and her daughter, they acknowledge understanding.  We will start her on preop antibiotics.  General surgery consulted.  Discussed with OR nurse in with Dr. Marcello Moores-- she is aware of patient, will be down when done with current surgery.  Final Clinical Impressions(s) / ED Diagnoses   Final diagnoses:  Other acute appendicitis    New Prescriptions New Prescriptions   No medications on file     Larene Pickett, PA-C 08/17/17 0140    Macarthur Critchley, MD 08/19/17 [phone]

## 2017-08-16 NOTE — ED Triage Notes (Signed)
Pt complains of right sided pain since 1am, saw her primary doctor this afternoon but no results of urine tests yet,  Pt states the pain has been getting worse this evenin

## 2017-08-17 ENCOUNTER — Observation Stay (HOSPITAL_COMMUNITY): Payer: Medicare Other | Admitting: Certified Registered Nurse Anesthetist

## 2017-08-17 ENCOUNTER — Encounter (HOSPITAL_COMMUNITY)
Admission: EM | Disposition: A | Discharge status: Home / Self Care | Payer: Self-pay | Source: Home / Self Care | Attending: Physician Assistant

## 2017-08-17 ENCOUNTER — Encounter (HOSPITAL_COMMUNITY): Payer: Self-pay

## 2017-08-17 DIAGNOSIS — D259 Leiomyoma of uterus, unspecified: Secondary | ICD-10-CM | POA: Diagnosis not present

## 2017-08-17 DIAGNOSIS — Z87891 Personal history of nicotine dependence: Secondary | ICD-10-CM | POA: Diagnosis not present

## 2017-08-17 DIAGNOSIS — K3580 Unspecified acute appendicitis: Secondary | ICD-10-CM | POA: Diagnosis present

## 2017-08-17 DIAGNOSIS — K3589 Other acute appendicitis without perforation or gangrene: Secondary | ICD-10-CM | POA: Diagnosis not present

## 2017-08-17 DIAGNOSIS — J8410 Pulmonary fibrosis, unspecified: Secondary | ICD-10-CM | POA: Diagnosis not present

## 2017-08-17 HISTORY — PX: LAPAROSCOPIC APPENDECTOMY: SHX408

## 2017-08-17 LAB — SURGICAL PCR SCREEN
MRSA, PCR: NEGATIVE
STAPHYLOCOCCUS AUREUS: NEGATIVE

## 2017-08-17 SURGERY — APPENDECTOMY, LAPAROSCOPIC
Anesthesia: General

## 2017-08-17 MED ORDER — SUCCINYLCHOLINE CHLORIDE 200 MG/10ML IV SOSY
PREFILLED_SYRINGE | INTRAVENOUS | Status: DC | PRN
  Administered 2017-08-17: 100 mg via INTRAVENOUS

## 2017-08-17 MED ORDER — DIPHENHYDRAMINE HCL 12.5 MG/5ML PO ELIX: 12.5000 mg | ORAL_SOLUTION | Freq: Four times a day (QID) | ORAL | Status: DC | PRN

## 2017-08-17 MED ORDER — SUCCINYLCHOLINE CHLORIDE 200 MG/10ML IV SOSY
PREFILLED_SYRINGE | INTRAVENOUS | Status: AC
  Filled 2017-08-17: qty 10

## 2017-08-17 MED ORDER — LIP MEDEX EX OINT
TOPICAL_OINTMENT | CUTANEOUS | Status: AC
  Filled 2017-08-17: qty 7

## 2017-08-17 MED ORDER — ROCURONIUM BROMIDE 50 MG/5ML IV SOSY
PREFILLED_SYRINGE | INTRAVENOUS | Status: AC
  Filled 2017-08-17: qty 5

## 2017-08-17 MED ORDER — DEXAMETHASONE SODIUM PHOSPHATE 10 MG/ML IJ SOLN
INTRAMUSCULAR | Status: AC
  Filled 2017-08-17: qty 1

## 2017-08-17 MED ORDER — SUGAMMADEX SODIUM 200 MG/2ML IV SOLN
INTRAVENOUS | Status: AC
  Filled 2017-08-17: qty 2

## 2017-08-17 MED ORDER — OXYCODONE HCL 5 MG PO TABS
10.0000 mg | ORAL_TABLET | ORAL | Status: DC | PRN
  Administered 2017-08-17 – 2017-08-18 (×3): 10 mg via ORAL
  Filled 2017-08-17 (×3): qty 2

## 2017-08-17 MED ORDER — OXYCODONE HCL 5 MG PO TABS: 5.0000 mg | ORAL_TABLET | Freq: Once | ORAL | Status: DC | PRN

## 2017-08-17 MED ORDER — DIPHENHYDRAMINE HCL 50 MG/ML IJ SOLN: 12.5000 mg | Freq: Four times a day (QID) | INTRAMUSCULAR | Status: DC | PRN

## 2017-08-17 MED ORDER — DEXAMETHASONE SODIUM PHOSPHATE 10 MG/ML IJ SOLN
INTRAMUSCULAR | Status: DC | PRN
  Administered 2017-08-17: 10 mg via INTRAVENOUS

## 2017-08-17 MED ORDER — LACTATED RINGERS IR SOLN
Status: DC | PRN
  Administered 2017-08-17: 1000 mL

## 2017-08-17 MED ORDER — LIDOCAINE 2% (20 MG/ML) 5 ML SYRINGE
INTRAMUSCULAR | Status: AC
  Filled 2017-08-17: qty 5

## 2017-08-17 MED ORDER — FENTANYL CITRATE (PF) 250 MCG/5ML IJ SOLN
INTRAMUSCULAR | Status: AC
  Filled 2017-08-17: qty 5

## 2017-08-17 MED ORDER — PROMETHAZINE HCL 25 MG/ML IJ SOLN: 6.2500 mg | INTRAMUSCULAR | Status: DC | PRN

## 2017-08-17 MED ORDER — PROPOFOL 10 MG/ML IV BOLUS
INTRAVENOUS | Status: DC | PRN
  Administered 2017-08-17: 150 mg via INTRAVENOUS

## 2017-08-17 MED ORDER — LIDOCAINE 2% (20 MG/ML) 5 ML SYRINGE
INTRAMUSCULAR | Status: DC | PRN
  Administered 2017-08-17: 100 mg via INTRAVENOUS

## 2017-08-17 MED ORDER — KCL IN DEXTROSE-NACL 20-5-0.45 MEQ/L-%-% IV SOLN
INTRAVENOUS | Status: DC
  Administered 2017-08-17 (×2): via INTRAVENOUS
  Filled 2017-08-17 (×3): qty 1000

## 2017-08-17 MED ORDER — BUPIVACAINE-EPINEPHRINE 0.25% -1:200000 IJ SOLN
INTRAMUSCULAR | Status: DC | PRN
  Administered 2017-08-17: 20 mL

## 2017-08-17 MED ORDER — LACTATED RINGERS IV SOLN
INTRAVENOUS | Status: DC | PRN
  Administered 2017-08-17 (×2): via INTRAVENOUS

## 2017-08-17 MED ORDER — METRONIDAZOLE IN NACL 5-0.79 MG/ML-% IV SOLN
500.0000 mg | Freq: Once | INTRAVENOUS | Status: AC
  Administered 2017-08-17: 500 mg via INTRAVENOUS
  Filled 2017-08-17: qty 100

## 2017-08-17 MED ORDER — KCL IN DEXTROSE-NACL 20-5-0.45 MEQ/L-%-% IV SOLN
INTRAVENOUS | Status: DC
  Administered 2017-08-17: 100 mL/h via INTRAVENOUS
  Filled 2017-08-17: qty 1000

## 2017-08-17 MED ORDER — MORPHINE SULFATE (PF) 2 MG/ML IV SOLN: 2.0000 mg | INTRAVENOUS | Status: DC | PRN

## 2017-08-17 MED ORDER — DEXTROSE 5 % IV SOLN: 2.0000 g | INTRAVENOUS | Status: DC

## 2017-08-17 MED ORDER — MORPHINE SULFATE (PF) 4 MG/ML IV SOLN
4.0000 mg | Freq: Once | INTRAVENOUS | Status: AC
  Administered 2017-08-17: 4 mg via INTRAVENOUS
  Filled 2017-08-17: qty 1

## 2017-08-17 MED ORDER — 0.9 % SODIUM CHLORIDE (POUR BTL) OPTIME
TOPICAL | Status: DC | PRN
  Administered 2017-08-17: 1000 mL

## 2017-08-17 MED ORDER — MIDAZOLAM HCL 2 MG/2ML IJ SOLN
INTRAMUSCULAR | Status: AC
  Filled 2017-08-17: qty 2

## 2017-08-17 MED ORDER — PROPOFOL 10 MG/ML IV BOLUS
INTRAVENOUS | Status: AC
  Filled 2017-08-17: qty 20

## 2017-08-17 MED ORDER — METRONIDAZOLE IN NACL 5-0.79 MG/ML-% IV SOLN
500.0000 mg | Freq: Four times a day (QID) | INTRAVENOUS | Status: DC
  Filled 2017-08-17: qty 100

## 2017-08-17 MED ORDER — HYDROMORPHONE HCL 1 MG/ML IJ SOLN
INTRAMUSCULAR | Status: AC
  Filled 2017-08-17: qty 2

## 2017-08-17 MED ORDER — MEPERIDINE HCL 50 MG/ML IJ SOLN: 6.2500 mg | INTRAMUSCULAR | Status: DC | PRN

## 2017-08-17 MED ORDER — ONDANSETRON 4 MG PO TBDP: 4.0000 mg | ORAL_TABLET | Freq: Four times a day (QID) | ORAL | Status: DC | PRN

## 2017-08-17 MED ORDER — FENTANYL CITRATE (PF) 100 MCG/2ML IJ SOLN
INTRAMUSCULAR | Status: DC | PRN
  Administered 2017-08-17 (×5): 50 ug via INTRAVENOUS

## 2017-08-17 MED ORDER — ONDANSETRON HCL 4 MG/2ML IJ SOLN
INTRAMUSCULAR | Status: DC | PRN
  Administered 2017-08-17: 4 mg via INTRAVENOUS

## 2017-08-17 MED ORDER — ROCURONIUM BROMIDE 50 MG/5ML IV SOSY
PREFILLED_SYRINGE | INTRAVENOUS | Status: DC | PRN
  Administered 2017-08-17: 30 mg via INTRAVENOUS

## 2017-08-17 MED ORDER — ENOXAPARIN SODIUM 40 MG/0.4ML ~~LOC~~ SOLN
40.0000 mg | SUBCUTANEOUS | Status: DC
  Administered 2017-08-17 – 2017-08-18 (×2): 40 mg via SUBCUTANEOUS
  Filled 2017-08-17 (×2): qty 0.4

## 2017-08-17 MED ORDER — BUPIVACAINE-EPINEPHRINE (PF) 0.25% -1:200000 IJ SOLN
INTRAMUSCULAR | Status: AC
  Filled 2017-08-17: qty 30

## 2017-08-17 MED ORDER — SUGAMMADEX SODIUM 200 MG/2ML IV SOLN
INTRAVENOUS | Status: DC | PRN
  Administered 2017-08-17: 200 mg via INTRAVENOUS

## 2017-08-17 MED ORDER — ONDANSETRON HCL 4 MG/2ML IJ SOLN
INTRAMUSCULAR | Status: AC
  Filled 2017-08-17: qty 2

## 2017-08-17 MED ORDER — OXYCODONE HCL 5 MG/5ML PO SOLN: 5.0000 mg | Freq: Once | ORAL | Status: DC | PRN

## 2017-08-17 MED ORDER — HYDROMORPHONE HCL 1 MG/ML IJ SOLN
0.2500 mg | INTRAMUSCULAR | Status: DC | PRN
  Administered 2017-08-17 (×2): 0.5 mg via INTRAVENOUS

## 2017-08-17 MED ORDER — MIDAZOLAM HCL 5 MG/5ML IJ SOLN
INTRAMUSCULAR | Status: DC | PRN
  Administered 2017-08-17: 2 mg via INTRAVENOUS

## 2017-08-17 MED ORDER — ONDANSETRON HCL 4 MG/2ML IJ SOLN: 4.0000 mg | Freq: Four times a day (QID) | INTRAMUSCULAR | Status: DC | PRN

## 2017-08-17 MED ORDER — DEXTROSE 5 % IV SOLN
2.0000 g | Freq: Once | INTRAVENOUS | Status: AC
  Administered 2017-08-17: 2 g via INTRAVENOUS
  Filled 2017-08-17: qty 2

## 2017-08-17 MED ORDER — LACTATED RINGERS IV SOLN
INTRAVENOUS | Status: DC
  Administered 2017-08-17: 1000 mL via INTRAVENOUS

## 2017-08-17 SURGICAL SUPPLY — 33 items
APPLIER CLIP 5 13 M/L LIGAMAX5 (MISCELLANEOUS)
APPLIER CLIP ROT 10 11.4 M/L (STAPLE)
CABLE HIGH FREQUENCY MONO STRZ (ELECTRODE) IMPLANT
CHLORAPREP W/TINT 26ML (MISCELLANEOUS) ×2 IMPLANT
CLIP APPLIE 5 13 M/L LIGAMAX5 (MISCELLANEOUS) IMPLANT
CLIP APPLIE ROT 10 11.4 M/L (STAPLE) IMPLANT
COVER SURGICAL LIGHT HANDLE (MISCELLANEOUS) ×2 IMPLANT
CUTTER FLEX LINEAR 45M (STAPLE) ×2 IMPLANT
DECANTER SPIKE VIAL GLASS SM (MISCELLANEOUS) ×2 IMPLANT
DERMABOND ADVANCED (GAUZE/BANDAGES/DRESSINGS) ×1
DERMABOND ADVANCED .7 DNX12 (GAUZE/BANDAGES/DRESSINGS) ×1 IMPLANT
DRAPE LAPAROSCOPIC ABDOMINAL (DRAPES) ×2 IMPLANT
ELECT REM PT RETURN 15FT ADLT (MISCELLANEOUS) ×2 IMPLANT
GLOVE BIOGEL PI IND STRL 7.5 (GLOVE) ×1 IMPLANT
GLOVE BIOGEL PI INDICATOR 7.5 (GLOVE) ×1
GLOVE ECLIPSE 7.5 STRL STRAW (GLOVE) ×2 IMPLANT
GOWN STRL REUS W/TWL XL LVL3 (GOWN DISPOSABLE) ×4 IMPLANT
KIT BASIN OR (CUSTOM PROCEDURE TRAY) ×2 IMPLANT
PAD POSITIONING PINK XL (MISCELLANEOUS) ×2 IMPLANT
POUCH SPECIMEN RETRIEVAL 10MM (ENDOMECHANICALS) ×2 IMPLANT
RELOAD 45 VASCULAR/THIN (ENDOMECHANICALS) IMPLANT
RELOAD STAPLE TA45 3.5 REG BLU (ENDOMECHANICALS) ×2 IMPLANT
SCISSORS LAP 5X35 DISP (ENDOMECHANICALS) ×2 IMPLANT
SET IRRIG TUBING LAPAROSCOPIC (IRRIGATION / IRRIGATOR) ×2 IMPLANT
SHEARS HARMONIC ACE PLUS 36CM (ENDOMECHANICALS) ×2 IMPLANT
SLEEVE XCEL OPT CAN 5 100 (ENDOMECHANICALS) ×2 IMPLANT
SUT MNCRL AB 4-0 PS2 18 (SUTURE) ×2 IMPLANT
TOWEL OR 17X26 10 PK STRL BLUE (TOWEL DISPOSABLE) ×2 IMPLANT
TRAY FOLEY W/METER SILVER 16FR (SET/KITS/TRAYS/PACK) ×2 IMPLANT
TRAY LAPAROSCOPIC (CUSTOM PROCEDURE TRAY) ×2 IMPLANT
TROCAR BLADELESS OPT 5 100 (ENDOMECHANICALS) ×2 IMPLANT
TROCAR XCEL BLUNT TIP 100MML (ENDOMECHANICALS) ×2 IMPLANT
TUBING INSUF HEATED (TUBING) ×2 IMPLANT

## 2017-08-17 NOTE — Progress Notes (Signed)
Patient ID: Katrina Evans, female   DOB: 1950/11/27, 66 y.o.   MRN: [id number] Day of Surgery   Subjective: Still with right lower quadrant abdominal pain unchanged.  Objective: Vital signs in last 24 hours: Temp:  [99.2 F (37.3 C)-100.3 F (37.9 C)] 100.3 F (37.9 C) (10/25 0520) Pulse Rate:  [79-111] 95 (10/25 0520) Resp:  [14-18] 18 (10/25 0520) BP: (127-166)/(65-105) 159/74 (10/25 0520) SpO2:  [94 %-100 %] 98 % (10/25 0520) Last BM Date: 08/16/17  Intake/Output from previous day: 10/24 0701 - 10/25 0700 In: 50 [IV Piggyback:50] Out: 450 [Urine:450] Intake/Output this shift: No intake/output data recorded.  General appearance: alert, cooperative, mild distress and moderately obese Abdomen: Significant tenderness and guarding right lower quadrant.  Lab Results:   Recent Labs  08/16/17 2024  WBC 12.5*  HGB 14.5  HCT 42.5  PLT 191   BMET  Recent Labs  08/16/17 2024  NA 138  K 3.6  CL 102  CO2 24  GLUCOSE 112*  BUN 17  CREATININE 0.78  CALCIUM 9.4     Studies/Results: Ct Abdomen Pelvis W Contrast  Result Date: 08/17/2017 CLINICAL DATA:  66 year old female with right lower quadrant abdominal pain. EXAM: CT ABDOMEN AND PELVIS WITH CONTRAST TECHNIQUE: Multidetector CT imaging of the abdomen and pelvis was performed using the standard protocol following bolus administration of intravenous contrast. CONTRAST:  157mL ISOVUE-300 IOPAMIDOL (ISOVUE-300) INJECTION 61% COMPARISON:  None. FINDINGS: Lower chest: Multiple small scattered calcified granuloma at the lung bases. The visualized lung bases are otherwise clear. No intra-abdominal free air or free fluid. Hepatobiliary: No focal liver abnormality is seen. No gallstones, gallbladder wall thickening, or biliary dilatation. Pancreas: Unremarkable. No pancreatic ductal dilatation or surrounding inflammatory changes. Spleen: Normal in size without focal abnormality. Adrenals/Urinary Tract: The adrenal glands are  unremarkable. Subcentimeter left renal hypodense lesion is too small to characterize but likely represents a cyst. Multiple small bilateral parapelvic cysts noted. There is no hydronephrosis on either side. The visualized ureters and urinary bladder appear unremarkable. Stomach/Bowel: There is a 15 mm duodenum diverticula at the head of the pancreas. There is no evidence of bowel obstruction. Small scattered sigmoid diverticula without active inflammatory changes noted. There is an appendicolith at the base of the appendix. The appendix is enlarged and inflamed. It is located in the right lower quadrant inferior to the cecum. No evidence of perforation or abscess. Vascular/Lymphatic: Mild atherosclerotic calcification of the abdominal aorta. No adenopathy. Reproductive: The uterus is anteverted. There is a 3.5 x 2.5 cm fundal fibroid. The ovaries are unremarkable as visualized. Other: None Musculoskeletal: Multiple vertebral body hemangioma most prominent at L2. No acute osseous pathology. IMPRESSION: 1. Acute appendicitis with an appendicolith at the base of the appendix. No evidence of perforation or abscess. 2. Diverticulosis without active inflammatory changes. 3. Uterine fibroid. Electronically Signed   By: Anner Crete M.D.   On: 08/17/2017 00:18    Anti-infectives: Anti-infectives    Start     Dose/Rate Route Frequency Ordered Stop   08/17/17 2200  cefTRIAXone (ROCEPHIN) 2 g in dextrose 5 % 50 mL IVPB    Comments:  Pharmacy may adjust dosing strength / duration / interval for maximal efficacy   2 g 100 mL/hr over 30 Minutes Intravenous Every 24 hours 08/17/17 0519     08/17/17 1200  metroNIDAZOLE (FLAGYL) IVPB 500 mg     500 mg 100 mL/hr over 60 Minutes Intravenous Every 6 hours 08/17/17 0519     08/17/17 0030  cefTRIAXone (  ROCEPHIN) 2 g in dextrose 5 % 50 mL IVPB     2 g 100 mL/hr over 30 Minutes Intravenous  Once 08/17/17 0028 08/17/17 0250   08/17/17 0030  metroNIDAZOLE (FLAGYL) IVPB  500 mg     500 mg 100 mL/hr over 60 Minutes Intravenous  Once 08/17/17 0028 08/17/17 0450      Assessment/Plan: Presents with acute abdominal pain and nausea with findings all consistent with acute uncomplicated appendicitis.  We have recommended proceeding with laparoscopic appendectomy.  The nature of the procedure and indications as well as risks of anesthetic complications, cardiorespiratory complications, bleeding, infection or possible need for open surgery were discussed and all questions answered.  She agrees to proceed.    LOS: 0 days    Katrina Evans 08/17/2017

## 2017-08-17 NOTE — Op Note (Signed)
Preoperative Diagnosis: Other acute appendicitis [K35.890]  Postoprative Diagnosis: Other acute appendicitis [K35.890]  Procedure: Procedure(s): APPENDECTOMY LAPAROSCOPIC   Surgeon: Excell Seltzer T   Assistants: None  Anesthesia:  General endotracheal anesthesia  Indications: The patient is a 66 year old female who presents with less than 24 hours of worsening right lower quadrant abdominal pain and nausea with localized tenderness and CT scan findings of uncomplicated acute appendicitis.  After discussion regarding indications and risks detailed elsewhere we have elected to proceed with emergency laparoscopic appendectomy.    Procedure Detail: Patient was brought to the operating room, placed in the supine position on the operating table, and general endotracheal anesthesia induced.  The abdomen was widely sterilely prepped and draped.  She was already on broad-spectrum IV antibiotics.  Foley catheter was placed.  Patient timeout was performed and correct procedure verified.  Access was obtained with a 1-1/2 cm incision at the umbilicus with an open Hassan technique through a mattress suture of 0 Vicryl and pneumoperitoneum established.  Under direct vision 5 mm trochars were placed in the epigastrium and left lower quadrant.  The appendix was just below the cecum and acutely inflamed with exudate but no gangrene or perforation.  The appendix was elevated and the cecum and appendix mobilized dividing lateral peritoneal attachments.  The mesoappendix was sequentially divided with harmonic scalpel and the appendix was dissected down onto the tip of the cecum which was relatively uninflamed.  The appendix was divided across the tip of the cecum with a single firing of the Endo GIA 45 mm blue load stapler.  The staple line was intact and without bleeding.  The appendix was placed in an Endo Catch bag and brought out through the umbilical incision.  The abdomen was thoroughly irrigated and complete  hemostasis obtained.  No evidence of injury or other problems.  The mattress suture was secured to the umbilicus and all CO2 evacuated and trochars removed.  Skin incisions were closed with subarticular Monocryl and Dermabond.  Sponge needle and instrument counts were correct.    Findings: Acute appendicitis without perforation or gangrene  Estimated Blood Loss:  Minimal         Drains: None  Blood Given: none          Specimens: Appendix        Complications:  * No complications entered in OR log *         Disposition: PACU - hemodynamically stable.         Condition: stable

## 2017-08-17 NOTE — Anesthesia Postprocedure Evaluation (Signed)
Anesthesia Post Note  Patient: Katrina Evans  Procedure(s) Performed: APPENDECTOMY LAPAROSCOPIC (N/A )     Patient location during evaluation: PACU Anesthesia Type: General Level of consciousness: awake and alert Pain management: pain level controlled Vital Signs Assessment: post-procedure vital signs reviewed and stable Respiratory status: spontaneous breathing, nonlabored ventilation and respiratory function stable Cardiovascular status: blood pressure returned to baseline and stable Postop Assessment: no apparent nausea or vomiting Anesthetic complications: no    Last Vitals:  Vitals:   08/17/17 1115 08/17/17 1118  BP: 131/78 134/75  Pulse: 90 (P) 90  Resp: 16 (P) 16  Temp:  36.9 C  SpO2: 96%     Last Pain:  Vitals:   08/17/17 1118  TempSrc:   PainSc: 0-No pain                 Lynda Rainwater

## 2017-08-17 NOTE — Anesthesia Preprocedure Evaluation (Signed)
Anesthesia Evaluation  Patient identified by MRN, date of birth, ID band Patient awake    Reviewed: Allergy & Precautions, NPO status , Patient's Chart, lab work & pertinent test results  Airway Mallampati: II  TM Distance: >3 FB Neck ROM: Full    Dental no notable dental hx.    Pulmonary neg pulmonary ROS, former smoker,    Pulmonary exam normal breath sounds clear to auscultation       Cardiovascular negative cardio ROS Normal cardiovascular exam Rhythm:Regular Rate:Normal     Neuro/Psych negative neurological ROS  negative psych ROS   GI/Hepatic negative GI ROS, Neg liver ROS,   Endo/Other  negative endocrine ROS  Renal/GU negative Renal ROS  negative genitourinary   Musculoskeletal negative musculoskeletal ROS (+)   Abdominal   Peds negative pediatric ROS (+)  Hematology negative hematology ROS (+)   Anesthesia Other Findings Appendicitis  Reproductive/Obstetrics negative OB ROS                             Anesthesia Physical Anesthesia Plan  ASA: II and emergent  Anesthesia Plan: General   Post-op Pain Management:    Induction: Intravenous, Rapid sequence and Cricoid pressure planned  PONV Risk Score and Plan: 3 and Ondansetron, Dexamethasone and Midazolam  Airway Management Planned: Oral ETT  Additional Equipment:   Intra-op Plan:   Post-operative Plan: Extubation in OR  Informed Consent: I have reviewed the patients History and Physical, chart, labs and discussed the procedure including the risks, benefits and alternatives for the proposed anesthesia with the patient or authorized representative who has indicated his/her understanding and acceptance.   Dental advisory given  Plan Discussed with: CRNA  Anesthesia Plan Comments:         Anesthesia Quick Evaluation

## 2017-08-17 NOTE — H&P (Signed)
Katrina Evans is an 66 y.o. female.   Chief Complaint: abd pain HPI: 66 year old female presents to the ED for right lower quadrant abdominal pain for ~24hrs.  She reports pain has worsened throughout the day.  She reports some associated nausea and decreased oral intake but no vomiting or diarrhea.  She denies any urinary symptoms.  Does report low-grade fever.  She has never had any abdominal surgery.     History reviewed. No pertinent past medical history.  History reviewed. No pertinent surgical history.  History reviewed. No pertinent family history. Social History:  reports that she has quit smoking. She has never used smokeless tobacco. She reports that she does not drink alcohol or use drugs.  Allergies: No Known Allergies   (Not in a hospital admission)  Results for orders placed or performed during the hospital encounter of 08/16/17 (from the past 48 hour(s))  Urinalysis, Routine w reflex microscopic- may I&O cath if menses     Status: Abnormal   Collection Time: 08/16/17  8:20 PM  Result Value Ref Range   Color, Urine YELLOW YELLOW   APPearance CLEAR CLEAR   Specific Gravity, Urine 1.013 1.005 - 1.030   pH 5.0 5.0 - 8.0   Glucose, UA NEGATIVE NEGATIVE mg/dL   Hgb urine dipstick MODERATE (A) NEGATIVE   Bilirubin Urine NEGATIVE NEGATIVE   Ketones, ur NEGATIVE NEGATIVE mg/dL   Protein, ur NEGATIVE NEGATIVE mg/dL   Nitrite NEGATIVE NEGATIVE   Leukocytes, UA NEGATIVE NEGATIVE   RBC / HPF 0-5 0 - 5 RBC/hpf   WBC, UA 0-5 0 - 5 WBC/hpf   Bacteria, UA NONE SEEN NONE SEEN   Squamous Epithelial / LPF 0-5 (A) NONE SEEN   Mucus PRESENT   Basic metabolic panel     Status: Abnormal   Collection Time: 08/16/17  8:24 PM  Result Value Ref Range   Sodium 138 135 - 145 mmol/L   Potassium 3.6 3.5 - 5.1 mmol/L   Chloride 102 101 - 111 mmol/L   CO2 24 22 - 32 mmol/L   Glucose, Bld 112 (H) 65 - 99 mg/dL   BUN 17 6 - 20 mg/dL   Creatinine, Ser 0.78 0.44 - 1.00 mg/dL   Calcium 9.4  8.9 - 10.3 mg/dL   GFR calc non Af Amer >60 >60 mL/min   GFR calc Af Amer >60 >60 mL/min    Comment: (NOTE) The eGFR has been calculated using the CKD EPI equation. This calculation has not been validated in all clinical situations. eGFR's persistently <60 mL/min signify possible Chronic Kidney Disease.    Anion gap 12 5 - 15  CBC     Status: Abnormal   Collection Time: 08/16/17  8:24 PM  Result Value Ref Range   WBC 12.5 (H) 4.0 - 10.5 K/uL   RBC 4.56 3.87 - 5.11 MIL/uL   Hemoglobin 14.5 12.0 - 15.0 g/dL   HCT 42.5 36.0 - 46.0 %   MCV 93.2 78.0 - 100.0 fL   MCH 31.8 26.0 - 34.0 pg   MCHC 34.1 30.0 - 36.0 g/dL   RDW 12.9 11.5 - 15.5 %   Platelets 191 150 - 400 K/uL   Ct Abdomen Pelvis W Contrast  Result Date: 08/17/2017 CLINICAL DATA:  65 year old female with right lower quadrant abdominal pain. EXAM: CT ABDOMEN AND PELVIS WITH CONTRAST TECHNIQUE: Multidetector CT imaging of the abdomen and pelvis was performed using the standard protocol following bolus administration of intravenous contrast. CONTRAST:  175m ISOVUE-300 IOPAMIDOL (ISOVUE-300)  INJECTION 61% COMPARISON:  None. FINDINGS: Lower chest: Multiple small scattered calcified granuloma at the lung bases. The visualized lung bases are otherwise clear. No intra-abdominal free air or free fluid. Hepatobiliary: No focal liver abnormality is seen. No gallstones, gallbladder wall thickening, or biliary dilatation. Pancreas: Unremarkable. No pancreatic ductal dilatation or surrounding inflammatory changes. Spleen: Normal in size without focal abnormality. Adrenals/Urinary Tract: The adrenal glands are unremarkable. Subcentimeter left renal hypodense lesion is too small to characterize but likely represents a cyst. Multiple small bilateral parapelvic cysts noted. There is no hydronephrosis on either side. The visualized ureters and urinary bladder appear unremarkable. Stomach/Bowel: There is a 15 mm duodenum diverticula at the head of the  pancreas. There is no evidence of bowel obstruction. Small scattered sigmoid diverticula without active inflammatory changes noted. There is an appendicolith at the base of the appendix. The appendix is enlarged and inflamed. It is located in the right lower quadrant inferior to the cecum. No evidence of perforation or abscess. Vascular/Lymphatic: Mild atherosclerotic calcification of the abdominal aorta. No adenopathy. Reproductive: The uterus is anteverted. There is a 3.5 x 2.5 cm fundal fibroid. The ovaries are unremarkable as visualized. Other: None Musculoskeletal: Multiple vertebral body hemangioma most prominent at L2. No acute osseous pathology. IMPRESSION: 1. Acute appendicitis with an appendicolith at the base of the appendix. No evidence of perforation or abscess. 2. Diverticulosis without active inflammatory changes. 3. Uterine fibroid. Electronically Signed   By: Anner Crete M.D.   On: 08/17/2017 00:18    Review of Systems  Constitutional: Negative for chills and fever.  HENT: Negative for congestion and hearing loss.   Eyes: Negative for blurred vision and double vision.  Respiratory: Negative for cough and shortness of breath.   Cardiovascular: Negative for chest pain and palpitations.  Gastrointestinal: Positive for abdominal pain and nausea.  Genitourinary: Negative for dysuria, frequency and urgency.  Skin: Negative for itching and rash.    Blood pressure (!) 141/74, pulse 93, temperature 99.2 F (37.3 C), temperature source Oral, resp. rate 18, SpO2 95 %. Physical Exam  Constitutional: She is oriented to person, place, and time. She appears well-developed and well-nourished. No distress.  HENT:  Head: Normocephalic and atraumatic.  Eyes: Pupils are equal, round, and reactive to light. Conjunctivae and EOM are normal.  Neck: Normal range of motion. Neck supple.  Cardiovascular: Normal rate and regular rhythm.   Respiratory: Effort normal and breath sounds normal. No  respiratory distress.  GI: Soft. She exhibits distension. There is tenderness (RLQ).  Musculoskeletal: Normal range of motion.  Neurological: She is alert and oriented to person, place, and time.  Skin: Skin is warm and dry.     Assessment/Plan 66 y.o. F with signs and symptoms of acute appendicitis.  She has been given cetriaxone and flagyl.  Will make NPO and admit to the floor with IVF's.  Plan for OR this AM.    Rosario Adie., MD 11/57/2620, 2:11 AM

## 2017-08-17 NOTE — Transfer of Care (Signed)
Immediate Anesthesia Transfer of Care Note  Patient: Katrina Evans  Procedure(s) Performed: APPENDECTOMY LAPAROSCOPIC (N/A )  Patient Location: PACU  Anesthesia Type:General  Level of Consciousness: awake, alert  and oriented  Airway & Oxygen Therapy: Patient connected to face mask oxygen  Post-op Assessment: Report given to RN and Post -op Vital signs reviewed and stable  Post vital signs: Reviewed and stable  Last Vitals:  Vitals:   08/17/17 0520 08/17/17 0750  BP: (!) 159/74 (!) 159/75  Pulse: 95 96  Resp: 18 20  Temp: 37.9 C 37.4 C  SpO2: 98% 97%    Last Pain:  Vitals:   08/17/17 0807  TempSrc:   PainSc: 3       Patients Stated Pain Goal: 4 (11/94/17 4081)  Complications: No apparent anesthesia complications

## 2017-08-17 NOTE — Anesthesia Procedure Notes (Signed)
Procedure Name: Intubation Date/Time: 08/17/2017 8:37 AM Performed by: Maxwell Caul Pre-anesthesia Checklist: Patient identified, Emergency Drugs available, Suction available and Patient being monitored Patient Re-evaluated:Patient Re-evaluated prior to induction Oxygen Delivery Method: Circle system utilized Preoxygenation: Pre-oxygenation with 100% oxygen Induction Type: IV induction, Rapid sequence and Cricoid Pressure applied Laryngoscope Size: Mac and 3 Grade View: Grade I Tube type: Oral Tube size: 7.5 mm Number of attempts: 1 Airway Equipment and Method: Stylet and Oral airway Placement Confirmation: ETT inserted through vocal cords under direct vision,  positive ETCO2 and breath sounds checked- equal and bilateral Secured at: 21 cm Tube secured with: Tape Dental Injury: Teeth and Oropharynx as per pre-operative assessment  Comments: No change in incision on left lower gum line.

## 2017-08-18 DIAGNOSIS — K3580 Unspecified acute appendicitis: Secondary | ICD-10-CM | POA: Diagnosis not present

## 2017-08-18 MED ORDER — OXYCODONE HCL 5 MG PO TABS: 5.0000 mg | ORAL_TABLET | ORAL | Status: DC | PRN

## 2017-08-18 MED ORDER — IBUPROFEN 200 MG PO TABS: 600.0000 mg | ORAL_TABLET | Freq: Four times a day (QID) | ORAL | Status: DC | PRN

## 2017-08-18 MED ORDER — OXYCODONE HCL 5 MG PO TABS: 5.0000 mg | ORAL_TABLET | ORAL | 0 refills | Status: DC | PRN

## 2017-08-18 MED ORDER — IBUPROFEN 200 MG PO TABS: ORAL_TABLET | ORAL | Status: DC

## 2017-08-18 MED ORDER — OXYCODONE HCL 10 MG PO TABS: 5.0000 mg | ORAL_TABLET | ORAL | 0 refills | Status: DC | PRN

## 2017-08-18 MED ORDER — OXYCODONE HCL 5 MG PO TABS: 5.0000 mg | ORAL_TABLET | Freq: Four times a day (QID) | ORAL | 0 refills | Status: DC | PRN

## 2017-08-18 MED ORDER — ACETAMINOPHEN 500 MG PO TABS: 500.0000 mg | ORAL_TABLET | Freq: Four times a day (QID) | ORAL | 0 refills | Status: DC | PRN

## 2017-08-18 NOTE — Progress Notes (Signed)
Pt was discharged home today. Instructions were reviewed with patient, and questions were answered. Pt was taken to main entrance via wheelchair by NT.  

## 2017-08-18 NOTE — Care Management Obs Status (Signed)
[facility] NOTIFICATION   Patient Details  Name: Katrina Evans MRN: [id number] Date of Birth: 1950-11-07   Medicare Observation Status Notification Given:  Yes    Guadalupe Maple, RN 08/18/2017, 12:02 PM

## 2017-08-18 NOTE — Discharge Summary (Signed)
Physician Discharge Summary  Patient ID: Katrina Evans MRN: [id number] DOB/AGE: 11-05-1950 66 y.o.  Admit date: 08/16/2017 Discharge date: 08/18/2017  Admission Diagnoses:  Appendicitis  Discharge Diagnoses:  Appendicitis  Active Problems:   Acute appendicitis   PROCEDURES: Laparoscopic appendectomy 08/17/17, Dr. Marland Kitchen [facility] Course:  66 year old female presents to the ED for right lower quadrant abdominal pain for ~24hrs. She reports pain has worsened throughout the day. She reports some associated nausea and decreased oral intake but no vomiting or diarrhea. She denies any urinary symptoms. Does report low-grade fever.  She has never had any abdominal surgery.   Patient was admitted by Dr. Marcello Moores.  She was seen later that a.m. by Dr. Excell Seltzer and taken to the operating room.  She underwent laparoscopic appendectomy.  Postoperatively she is done well.  She has the normal routine postoperative soreness.  She also has some swelling of her lip on the right side of her mouth which appears to be where she was intubated.  She had recently undergone surgery on the left side of her face.  From our standpoint she is ready to go home.  We have asked anesthesia to look at her prior to discharge.  CBC Latest Ref Rng & Units 08/16/2017  WBC 4.0 - 10.5 K/uL 12.5(H)  Hemoglobin 12.0 - 15.0 g/dL 14.5  Hematocrit 36.0 - 46.0 % 42.5  Platelets 150 - 400 K/uL 191   CMP Latest Ref Rng & Units 08/16/2017  Glucose 65 - 99 mg/dL 112(H)  BUN 6 - 20 mg/dL 17  Creatinine 0.44 - 1.00 mg/dL 0.78  Sodium 135 - 145 mmol/L 138  Potassium 3.5 - 5.1 mmol/L 3.6  Chloride 101 - 111 mmol/L 102  CO2 22 - 32 mmol/L 24  Calcium 8.9 - 10.3 mg/dL 9.4   Condition on discharge: Improved  Disposition: Final discharge disposition not confirmed   Allergies as of 08/18/2017   No Known Allergies     Medication List    TAKE these medications   acetaminophen 500 MG tablet Commonly known as:   TYLENOL Take 1-2 tablets (500-1,000 mg total) by mouth every 6 (six) hours as needed for mild pain, moderate pain, fever or headache. What changed:  how much to take  reasons to take this   hyoscyamine 0.125 MG tablet Commonly known as:  LEVSIN, ANASPAZ Take 0.125 mg by mouth every 6 (six) hours as needed for bladder spasms.   ibuprofen 200 MG tablet Commonly known as:  ADVIL,MOTRIN He can take 2-3 tablets every 6 hours as needed for pain.  You can also alternate this with plain Tylenol for pain.  You can buy this over-the-counter at any drugstore without a prescription.   oxyCODONE 5 MG immediate release tablet Commonly known as:  Oxy IR/ROXICODONE Take 1-2 tablets (5-10 mg total) by mouth every 6 (six) hours as needed for moderate pain, severe pain or breakthrough pain.      Follow-up Information    [facility] [place] Follow up.   Specialty:  General Surgery Why:  your appointment is at 11:45 AM.  Be at the office 30 minutes early for check in.  Bring photo ID and insurance information with you.   Contact information: [address] [place] [address] [phone]           Signed: Earnstine Regal 08/18/2017, 11:50 AM

## 2017-08-18 NOTE — Discharge Instructions (Signed)
[facility] ______[facility] LAPAROSCOPIC SURGERY: POST OP INSTRUCTIONS Always review your discharge instruction sheet given to you by the facility where your surgery was performed. IF YOU HAVE DISABILITY OR FAMILY LEAVE FORMS, YOU MUST BRING THEM TO THE OFFICE FOR PROCESSING.   DO NOT GIVE THEM TO YOUR DOCTOR.  1. A prescription for pain medication may be given to you upon discharge.  Take your pain medication as prescribed, if needed.  If narcotic pain medicine is not needed, then you may take acetaminophen (Tylenol) or ibuprofen (Advil) as needed. 2. Take your usually prescribed medications unless otherwise directed. 3. If you need a refill on your pain medication, please contact your pharmacy.  They will contact our office to request authorization. Prescriptions will not be filled after 5pm or on week-ends. 4. You should follow a light diet the first few days after arrival home, such as soup and crackers, etc.  Be sure to include lots of fluids daily. 5. Most patients will experience some swelling and bruising in the area of the incisions.  Ice packs will help.  Swelling and bruising can take several days to resolve.  6. It is common to experience some constipation if taking pain medication after surgery.  Increasing fluid intake and taking a stool softener (such as Colace) will usually help or prevent this problem from occurring.  A mild laxative (Milk of Magnesia or Miralax) should be taken according to package instructions if there are no bowel movements after 48 hours. 7. Unless discharge instructions indicate otherwise, you may remove your bandages 24-48 hours after surgery, and you may shower at that time.  You may have steri-strips (small skin tapes) in place directly over the incision.  These strips should be left on the skin for 7-10 days.  If your surgeon used skin glue on the incision, you may shower in 24 hours.  The glue will flake off over the next 2-3 weeks.  Any sutures or  staples will be removed at the office during your follow-up visit. 8. ACTIVITIES:  You may resume regular (light) daily activities beginning the next day--such as daily self-care, walking, climbing stairs--gradually increasing activities as tolerated.  You may have sexual intercourse when it is comfortable.  Refrain from any heavy lifting or straining until approved by your doctor. a. You may drive when you are no longer taking prescription pain medication, you can comfortably wear a seatbelt, and you can safely maneuver your car and apply brakes.  9. You should see your doctor in the office for a follow-up appointment approximately 2-3 weeks after your surgery.  Make sure that you call for this appointment within a day or two after you arrive home to insure a convenient appointment time.  WHEN TO CALL YOUR DOCTOR: 1. Fever over 101.0 2. Inability to urinate 3. Continued bleeding from incision. 4. Increased pain, redness, or drainage from the incision. 5. Increasing abdominal pain  The clinic staff is available to answer your questions during regular business hours.  Please dont hesitate to call and ask to speak to one of the nurses for clinical concerns.  If you have a medical emergency, go to the nearest emergency room or call 911.  A surgeon from [facility] is always on call at the hospital. [address], [place], [place], [place]  [address] ? [address], [place], [place]   [phone] ? [phone] ? FAX [phone] Web site: [email or web address]   Laparoscopic Appendectomy, Adult, Care After Refer  to this sheet in the next few weeks. These instructions provide you with information about caring for yourself after your procedure. Your health care provider may also give you more specific instructions. Your treatment has been planned according to current medical practices, but problems sometimes occur. Call your health care provider if you have  any problems or questions after your procedure. What can I expect after the procedure? After the procedure, it is common to have:  A decrease in your energy level.  Mild pain in the area where the surgical cuts (incisions) were made.  Constipation. This can be caused by pain medicine and a decrease in your activity.  Follow these instructions at home: Medicines  Take over-the-counter and prescription medicines only as told by your health care provider.  Do not drive for 24 hours if you received a sedative.  Do not drive or operate heavy machinery while taking prescription pain medicine.  If you were prescribed an antibiotic medicine, take it as told by your health care provider. Do not stop taking the antibiotic even if you start to feel better. Activity  For 3 weeks or as long as told by your health care provider: ? Do not lift anything that is heavier than 10 pounds (4.5 kg). ? Do not play contact sports.  Gradually return to your normal activities. Ask your health care provider what activities are safe for you. Bathing  Keep your incisions clean and dry. Clean them as often as told by your health care provider: ? Gently wash the incisions with soap and water. ? Rinse the incisions with water to remove all soap. ? Pat the incisions dry with a clean towel. Do not rub the incisions.  You may take showers after 48 hours.  Do not take baths, swim, or use hot tubs for 2 weeks or as told by your health care provider. Incision care  Follow instructions from your healthcare provider about how to take care of your incisions. Make sure you: ? Wash your hands with soap and water before you change your bandage (dressing). If soap and water are not available, use hand sanitizer. ? Change your dressing as told by your health care provider. ? Leave stitches (sutures), skin glue, or adhesive strips in place. These skin closures may need to stay in place for 2 weeks or longer. If adhesive  strip edges start to loosen and curl up, you may trim the loose edges. Do not remove adhesive strips completely unless your health care provider tells you to do that.  Check your incision areas every day for signs of infection. Check for: ? More redness, swelling, or pain. ? More fluid or blood. ? Warmth. ? Pus or a bad smell. Other Instructions  If you were sent home with a drain, follow instructions from your health care provider about how to care for the drain and how to empty it.  Take deep breaths. This helps to prevent your lungs from becoming inflamed.  To relieve and prevent constipation: ? Drink plenty of fluids. ? Eat plenty of fruits and vegetables.  Keep all follow-up visits as told by your health care provider. This is important. Contact a health care provider if:  You have more redness, swelling, or pain around an incision.  You have more fluid or blood coming from an incision.  Your incision feels warm to the touch.  You have pus or a bad smell coming from an incision or dressing.  Your incision edges break open after your  sutures have been removed.  You have increasing pain in your shoulders.  You feel dizzy or you faint.  You develop shortness of breath.  You keep feeling nauseous or vomiting.  You have diarrhea or you cannot control your bowel functions.  You lose your appetite.  You develop swelling or pain in your legs. Get help right away if:  You have a fever.  You develop a rash.  You have difficulty breathing.  You have sharp pains in your chest. This information is not intended to replace advice given to you by your health care provider. Make sure you discuss any questions you have with your health care provider. Document Released: 10/10/2005 Document Revised: 03/11/2016 Document Reviewed: 03/30/2015 Elsevier Interactive Patient Education  2017 [employer].

## 2017-08-18 NOTE — Progress Notes (Signed)
1 Day Post-Op    CC: Abdominal pain  Subjective: Patient is tolerating a soft diet, port sites look good.  She has some swelling and a small ulcer on the right side of her mouth.  Apparently where she she was intubated.  That is her major complaint, aside from abdominal soreness.  Objective: Vital signs in last 24 hours: Temp:  [97.8 F (36.6 C)-99.4 F (37.4 C)] 98.5 F (36.9 C) (10/26 0527) Pulse Rate:  [62-90] 62 (10/26 0527) Resp:  [14-18] 16 (10/26 0527) BP: (114-137)/(63-75) 126/63 (10/26 0527) SpO2:  [92 %-97 %] 95 % (10/26 0527) Last BM Date: 08/16/17 1460 p.o.  2415 IV 2700 urine   intake/Output from previous day: 10/25 0701 - 10/26 0700 In: 3875 [P.O.:1460; I.V.:2415] Out: 2720 [Urine:2700; Blood:20] Intake/Output this shift: No intake/output data recorded.  General appearance: alert, cooperative, no distress;  alert, cooperative, no distress and She has some swelling and soreness at the right lateral lower lip with a small.  This is new. Resp: Clear to auscultation GI: Soft, sore, port sites look good.  Some bruising.  Tolerating diet well.  Lab Results:   Recent Labs  08/16/17 2024  WBC 12.5*  HGB 14.5  HCT 42.5  PLT 191    BMET  Recent Labs  08/16/17 2024  NA 138  K 3.6  CL 102  CO2 24  GLUCOSE 112*  BUN 17  CREATININE 0.78  CALCIUM 9.4   PT/INR No results for input(s): LABPROT, INR in the last 72 hours.  No results for input(s): AST, ALT, ALKPHOS, BILITOT, PROT, ALBUMIN in the last 168 hours.   Lipase  No results found for: LIPASE   Prior to Admission medications   Medication Sig Start Date End Date Taking? Authorizing Provider  acetaminophen (TYLENOL) 500 MG tablet Take 500 mg by mouth every 6 (six) hours as needed for moderate pain.   Yes [provider]  hyoscyamine (LEVSIN, ANASPAZ) 0.125 MG tablet Take 0.125 mg by mouth every 6 (six) hours as needed for bladder spasms.  08/16/17   [provider]     Medications: . enoxaparin (LOVENOX) injection  40 mg Subcutaneous Q24H   . dextrose 5 % and 0.45 % NaCl with KCl 20 mEq/L 75 mL/hr at 08/17/17 2333   Anti-infectives    Start     Dose/Rate Route Frequency Ordered Stop   08/17/17 2200  cefTRIAXone (ROCEPHIN) 2 g in dextrose 5 % 50 mL IVPB  Status:  Discontinued    Comments:  Pharmacy may adjust dosing strength / duration / interval for maximal efficacy   2 g 100 mL/hr over 30 Minutes Intravenous Every 24 hours 08/17/17 0519 08/17/17 1146   08/17/17 1200  metroNIDAZOLE (FLAGYL) IVPB 500 mg  Status:  Discontinued     500 mg 100 mL/hr over 60 Minutes Intravenous Every 6 hours 08/17/17 0519 08/17/17 1146   08/17/17 0030  cefTRIAXone (ROCEPHIN) 2 g in dextrose 5 % 50 mL IVPB     2 g 100 mL/hr over 30 Minutes Intravenous  Once 08/17/17 0028 08/17/17 0250   08/17/17 0030  metroNIDAZOLE (FLAGYL) IVPB 500 mg     500 mg 100 mL/hr over 60 Minutes Intravenous  Once 08/17/17 0028 08/17/17 0450     Assessment/Plan Acute appendicitis Laparoscopic appendectomy 08/17/17, [facility] [place], POD 1 FEN: IV fluids/regular diet ID: Flagyl and Rocephin preop DVT: Lovenox Foley: None Follow-up: [facility]   Plan: I did ask anesthesia to look at her ulcer and right lip swelling.  Once she is cleared I think she can go home.  We will set her up for follow-up in the [place] in 2-3 weeks.  LOS: 0 days    Jearline Hirschhorn 08/18/2017 [phone]

## 2017-08-28 ENCOUNTER — Ambulatory Visit
Admission: RE | Admit: 2017-08-28 | Discharge: 2017-08-28 | Disposition: A | Discharge status: Home / Self Care | Payer: Medicare Other | Source: Ambulatory Visit | Attending: Physician Assistant | Admitting: Physician Assistant

## 2017-08-28 ENCOUNTER — Ambulatory Visit: Payer: Medicare Other

## 2017-08-28 DIAGNOSIS — Z1231 Encounter for screening mammogram for malignant neoplasm of breast: Secondary | ICD-10-CM | POA: Diagnosis not present

## 2017-09-10 DIAGNOSIS — Z23 Encounter for immunization: Secondary | ICD-10-CM | POA: Diagnosis not present

## 2017-09-18 DIAGNOSIS — I788 Other diseases of capillaries: Secondary | ICD-10-CM | POA: Diagnosis not present

## 2017-09-18 DIAGNOSIS — L738 Other specified follicular disorders: Secondary | ICD-10-CM | POA: Diagnosis not present

## 2017-09-18 DIAGNOSIS — L821 Other seborrheic keratosis: Secondary | ICD-10-CM | POA: Diagnosis not present

## 2017-11-17 ENCOUNTER — Encounter: Payer: Self-pay | Admitting: Podiatry

## 2017-11-17 ENCOUNTER — Ambulatory Visit (INDEPENDENT_AMBULATORY_CARE_PROVIDER_SITE_OTHER): Payer: Medicare Other | Admitting: Podiatry

## 2017-11-17 DIAGNOSIS — M722 Plantar fascial fibromatosis: Secondary | ICD-10-CM | POA: Diagnosis not present

## 2017-11-17 MED ORDER — TRIAMCINOLONE ACETONIDE 10 MG/ML IJ SUSP
10.0000 mg | Freq: Once | INTRAMUSCULAR | Status: AC
  Administered 2017-11-17: 10 mg

## 2017-11-17 NOTE — Progress Notes (Signed)
Subjective:   Patient ID: Katrina Evans, female   DOB: 67 y.o.   MRN: [id number]   HPI Patient states she had about 4 months of relief but has developed pain in her heel again over the last couple weeks and is concerned   ROS      Objective:  Physical Exam  Neurovascular status intact with moderate discomfort in the plantar medial central heel fascia right with no intensity to it at the current time     Assessment:  Fasciitis which seems to be improving but still present     Plan:  Since it has been 4 months I did go ahead and inject and I discussed I cannot do this on any kind of regular basis and if it were to come back again we are going to need to consider shockwave therapy.  She will reappoint 3 weeks or earlier if needed

## 2017-11-20 ENCOUNTER — Ambulatory Visit: Payer: Medicare Other | Admitting: Podiatry

## 2017-11-29 DIAGNOSIS — M545 Low back pain: Secondary | ICD-10-CM | POA: Diagnosis not present

## 2017-11-29 DIAGNOSIS — M25551 Pain in right hip: Secondary | ICD-10-CM | POA: Diagnosis not present

## 2017-12-04 ENCOUNTER — Other Ambulatory Visit: Payer: Self-pay | Admitting: Physician Assistant

## 2017-12-04 ENCOUNTER — Ambulatory Visit
Admission: RE | Admit: 2017-12-04 | Discharge: 2017-12-04 | Disposition: A | Discharge status: Home / Self Care | Payer: Medicare Other | Source: Ambulatory Visit | Attending: Physician Assistant | Admitting: Physician Assistant

## 2017-12-04 DIAGNOSIS — R109 Unspecified abdominal pain: Secondary | ICD-10-CM | POA: Diagnosis not present

## 2017-12-04 DIAGNOSIS — R319 Hematuria, unspecified: Secondary | ICD-10-CM | POA: Diagnosis not present

## 2017-12-04 DIAGNOSIS — R1031 Right lower quadrant pain: Secondary | ICD-10-CM | POA: Diagnosis not present

## 2017-12-07 ENCOUNTER — Ambulatory Visit (INDEPENDENT_AMBULATORY_CARE_PROVIDER_SITE_OTHER): Payer: Medicare Other | Admitting: Podiatry

## 2017-12-07 DIAGNOSIS — M722 Plantar fascial fibromatosis: Secondary | ICD-10-CM | POA: Diagnosis not present

## 2017-12-07 DIAGNOSIS — M779 Enthesopathy, unspecified: Secondary | ICD-10-CM

## 2017-12-07 MED ORDER — DICLOFENAC SODIUM 75 MG PO TBEC: 75.0000 mg | DELAYED_RELEASE_TABLET | Freq: Two times a day (BID) | ORAL | 2 refills | Status: DC

## 2017-12-07 NOTE — Progress Notes (Signed)
Subjective:   Patient ID: Katrina Evans, female   DOB: 67 y.o.   MRN: [id number]   HPI Patient presents stating it is doing a lot better with minimal discomfort at the current time   ROS      Objective:  Physical Exam  Neurovascular status intact with discomfort plantar heel which has subsided to a significant degree which is still tender upon deep palpation but significantly improved     Assessment:  Significant improvement chronic plantar fasciitis right     Plan:  Martin Majestic ahead today discussed the continuation of physical therapy anti-inflammatory supportive shoes and patient will be seen back for [place] to recheck again as needed and will utilize physical therapy at the current time

## 2017-12-19 DIAGNOSIS — D1801 Hemangioma of skin and subcutaneous tissue: Secondary | ICD-10-CM | POA: Diagnosis not present

## 2017-12-19 DIAGNOSIS — L918 Other hypertrophic disorders of the skin: Secondary | ICD-10-CM | POA: Diagnosis not present

## 2017-12-19 DIAGNOSIS — L821 Other seborrheic keratosis: Secondary | ICD-10-CM | POA: Diagnosis not present

## 2017-12-19 DIAGNOSIS — Z85820 Personal history of malignant melanoma of skin: Secondary | ICD-10-CM | POA: Diagnosis not present

## 2017-12-20 DIAGNOSIS — R319 Hematuria, unspecified: Secondary | ICD-10-CM | POA: Diagnosis not present

## 2017-12-26 DIAGNOSIS — R310 Gross hematuria: Secondary | ICD-10-CM | POA: Diagnosis not present

## 2017-12-26 DIAGNOSIS — N393 Stress incontinence (female) (male): Secondary | ICD-10-CM | POA: Diagnosis not present

## 2018-01-04 DIAGNOSIS — R310 Gross hematuria: Secondary | ICD-10-CM | POA: Diagnosis not present

## 2018-03-23 ENCOUNTER — Encounter: Payer: Self-pay | Admitting: Podiatry

## 2018-03-23 ENCOUNTER — Ambulatory Visit (INDEPENDENT_AMBULATORY_CARE_PROVIDER_SITE_OTHER): Payer: Medicare Other | Admitting: Podiatry

## 2018-03-23 DIAGNOSIS — M722 Plantar fascial fibromatosis: Secondary | ICD-10-CM | POA: Diagnosis not present

## 2018-03-23 MED ORDER — TRIAMCINOLONE ACETONIDE 10 MG/ML IJ SUSP
10.0000 mg | Freq: Once | INTRAMUSCULAR | Status: AC
  Administered 2018-03-23: 10 mg

## 2018-03-23 MED ORDER — DICLOFENAC SODIUM 75 MG PO TBEC: 75.0000 mg | DELAYED_RELEASE_TABLET | Freq: Two times a day (BID) | ORAL | 2 refills | Status: DC

## 2018-03-26 NOTE — Progress Notes (Signed)
Subjective:   Patient ID: Katrina Evans, female   DOB: 67 y.o.   MRN: [id number]   HPI Patient states overall doing well but having discomfort in the bottom of the right heel   ROS      Objective:  Physical Exam  Neurovascular status intact with inflammation pain of the right plantar heel with fluid buildup     Assessment:  Reoccurrence fasciitis right     Plan:  Advised on supportive shoes physical therapy reviewed x-rays and today injected the plantar fascia 3 mg Kenalog 5 mg Xylocaine and advised on stretching exercises  X-rays indicate that there is no indication of stress fracture or advanced arthritis with small spur

## 2018-06-13 ENCOUNTER — Ambulatory Visit
Admission: RE | Admit: 2018-06-13 | Discharge: 2018-06-13 | Disposition: A | Discharge status: Home / Self Care | Payer: Medicare Other | Source: Ambulatory Visit | Attending: Physician Assistant | Admitting: Physician Assistant

## 2018-06-13 ENCOUNTER — Other Ambulatory Visit: Payer: Self-pay | Admitting: Physician Assistant

## 2018-06-13 DIAGNOSIS — Z0000 Encounter for general adult medical examination without abnormal findings: Secondary | ICD-10-CM | POA: Diagnosis not present

## 2018-06-13 DIAGNOSIS — Z136 Encounter for screening for cardiovascular disorders: Secondary | ICD-10-CM | POA: Diagnosis not present

## 2018-06-13 DIAGNOSIS — Z1159 Encounter for screening for other viral diseases: Secondary | ICD-10-CM | POA: Diagnosis not present

## 2018-06-13 DIAGNOSIS — Z131 Encounter for screening for diabetes mellitus: Secondary | ICD-10-CM | POA: Diagnosis not present

## 2018-06-13 DIAGNOSIS — R030 Elevated blood-pressure reading, without diagnosis of hypertension: Secondary | ICD-10-CM | POA: Diagnosis not present

## 2018-06-13 DIAGNOSIS — M25552 Pain in left hip: Secondary | ICD-10-CM | POA: Diagnosis not present

## 2018-06-13 DIAGNOSIS — M25551 Pain in right hip: Secondary | ICD-10-CM

## 2018-06-13 DIAGNOSIS — M8580 Other specified disorders of bone density and structure, unspecified site: Secondary | ICD-10-CM | POA: Diagnosis not present

## 2018-06-13 DIAGNOSIS — E669 Obesity, unspecified: Secondary | ICD-10-CM | POA: Diagnosis not present

## 2018-06-13 DIAGNOSIS — E7800 Pure hypercholesterolemia, unspecified: Secondary | ICD-10-CM | POA: Diagnosis not present

## 2018-06-13 DIAGNOSIS — Z85820 Personal history of malignant melanoma of skin: Secondary | ICD-10-CM | POA: Diagnosis not present

## 2018-06-19 DIAGNOSIS — Z85820 Personal history of malignant melanoma of skin: Secondary | ICD-10-CM | POA: Diagnosis not present

## 2018-06-19 DIAGNOSIS — L821 Other seborrheic keratosis: Secondary | ICD-10-CM | POA: Diagnosis not present

## 2018-06-19 DIAGNOSIS — D1801 Hemangioma of skin and subcutaneous tissue: Secondary | ICD-10-CM | POA: Diagnosis not present

## 2018-06-19 DIAGNOSIS — L720 Epidermal cyst: Secondary | ICD-10-CM | POA: Diagnosis not present

## 2018-06-20 ENCOUNTER — Ambulatory Visit (INDEPENDENT_AMBULATORY_CARE_PROVIDER_SITE_OTHER): Payer: Medicare Other | Admitting: Podiatry

## 2018-06-20 ENCOUNTER — Encounter: Payer: Self-pay | Admitting: Podiatry

## 2018-06-20 DIAGNOSIS — R600 Localized edema: Secondary | ICD-10-CM

## 2018-06-20 DIAGNOSIS — M722 Plantar fascial fibromatosis: Secondary | ICD-10-CM | POA: Diagnosis not present

## 2018-06-22 NOTE — Progress Notes (Signed)
Subjective:   Patient ID: Katrina Evans, female   DOB: 67 y.o.   MRN: [id number]   HPI Patient states the right heel is extremely tender and also has some swelling.  States that heels really been bothering her   ROS      Objective:  Physical Exam  Chronic plantar fasciitis that has not gotten better and is been treated conservatively     Assessment:  Pain in the right plantar heel at the insertion calcaneus     Plan:  Recommended shockwave therapy for this and she is scheduled next week for shockwave and I educated on shockwave and treatment

## 2018-06-27 ENCOUNTER — Ambulatory Visit (INDEPENDENT_AMBULATORY_CARE_PROVIDER_SITE_OTHER): Payer: Medicare Other

## 2018-06-27 DIAGNOSIS — M722 Plantar fascial fibromatosis: Secondary | ICD-10-CM

## 2018-06-27 DIAGNOSIS — M79676 Pain in unspecified toe(s): Secondary | ICD-10-CM

## 2018-07-04 ENCOUNTER — Ambulatory Visit: Payer: Self-pay

## 2018-07-04 DIAGNOSIS — B351 Tinea unguium: Secondary | ICD-10-CM

## 2018-07-04 DIAGNOSIS — M722 Plantar fascial fibromatosis: Secondary | ICD-10-CM

## 2018-07-04 NOTE — Progress Notes (Signed)
Katrina Evans is here today with complaint about pain in her right heel.  She has had this pain off and on for approximately 3 years.  She is tried various modalities such as injections, anti-inflammatories, and stretching.  Nothing has helped relieve her pain.  Pain on palpation to the right plantar heel at insertion the calcaneus.  Pain when palpating mid to medial plantar fascial band.  ESWT administered for 6 J and tolerated well.  Advised patient on avoidance of NSAIDs and ice, advised her to utilize her boot posttreatment.  She is to follow-up next week for second treatment.

## 2018-07-04 NOTE — Progress Notes (Signed)
Katrina Evans is here today with complaint about pain in her right heel.  She has had this pain off and on for approximately 3 years.  She is tried various modalities such as injections, anti-inflammatories, and stretching.  Nothing has helped relieve her pain.  Pain on palpation to the right plantar heel at insertion the calcaneus.  Pain when palpating mid to medial plantar fascial band.  ESWT administered for 5 J and tolerated well.  Advised patient on avoidance of NSAIDs and ice, advised her to utilize her boot posttreatment.  She is to follow-up next week for 3rd treatment.

## 2018-07-11 ENCOUNTER — Ambulatory Visit: Payer: Self-pay

## 2018-07-11 DIAGNOSIS — M79676 Pain in unspecified toe(s): Secondary | ICD-10-CM

## 2018-07-11 DIAGNOSIS — M722 Plantar fascial fibromatosis: Secondary | ICD-10-CM

## 2018-07-13 NOTE — Progress Notes (Signed)
Katrina Evans is here today with complaint about pain in her right heel.  She has had this pain off and on for approximately 3 years.  She is tried various modalities such as injections, anti-inflammatories, and stretching.  Nothing has helped relieve her pain.  Pain on palpation to the right plantar heel at insertion the calcaneus.  Pain when palpating mid to medial plantar fascial band.  ESWT administered for 5 J and tolerated well.  Advised patient on avoidance of NSAIDs and ice, advised her to utilize her boot posttreatment.  She is to follow-up in 4 weeks for fourth treatment.

## 2018-07-17 ENCOUNTER — Telehealth: Payer: Self-pay | Admitting: Podiatry

## 2018-07-17 NOTE — Telephone Encounter (Signed)
I called pt to let her know that her medical records were ready for her to pick up and that I was placing them at the front desk for her.

## 2018-07-26 DIAGNOSIS — H2513 Age-related nuclear cataract, bilateral: Secondary | ICD-10-CM | POA: Diagnosis not present

## 2018-07-26 DIAGNOSIS — H524 Presbyopia: Secondary | ICD-10-CM | POA: Diagnosis not present

## 2018-08-03 DIAGNOSIS — Z23 Encounter for immunization: Secondary | ICD-10-CM | POA: Diagnosis not present

## 2018-08-08 ENCOUNTER — Ambulatory Visit: Payer: Self-pay

## 2018-08-08 DIAGNOSIS — M722 Plantar fascial fibromatosis: Secondary | ICD-10-CM

## 2018-08-08 DIAGNOSIS — M79676 Pain in unspecified toe(s): Secondary | ICD-10-CM

## 2018-08-13 DIAGNOSIS — M542 Cervicalgia: Secondary | ICD-10-CM | POA: Diagnosis not present

## 2018-08-16 NOTE — Progress Notes (Signed)
Katrina Evans is here today with complaint about pain in her right heel.  She has had this pain off and on for approximately 3 years.  She is tried various modalities such as injections, anti-inflammatories, and stretching.  Nothing has helped relieve her pain.  Pain on palpation to the right plantar heel at insertion the calcaneus.  Pain when palpating mid to medial plantar fascial band.  ESWT administered for 3. 5 J and tolerated well.  Advised patient on avoidance of NSAIDs and ice, advised her to utilize her boot posttreatment.  She is to follow-up in 2 weeks for 5th  treatment.

## 2018-08-20 DIAGNOSIS — M8588 Other specified disorders of bone density and structure, other site: Secondary | ICD-10-CM | POA: Diagnosis not present

## 2018-08-22 ENCOUNTER — Ambulatory Visit: Payer: Self-pay

## 2018-08-22 DIAGNOSIS — M722 Plantar fascial fibromatosis: Secondary | ICD-10-CM

## 2018-08-24 NOTE — Progress Notes (Signed)
Damali is here today with complaint about pain in her right heel.  She has had this pain off and on for approximately 3 years.  She is tried various modalities such as injections, anti-inflammatories, and stretching.  Nothing has helped relieve her pain. She has noticed an improvement but she fell  A couple weeks ago and her pain has returned.   Pain on palpation to the right plantar heel at insertion the calcaneus.  Pain when palpating mid to medial plantar fascial band.  ESWT administered for 4.5 J and tolerated well.  Advised patient on avoidance of NSAIDs and ice, advised her to utilize her boot posttreatment.  She is to follow-up in 2 weeks for 6th  treatment.  I did tell her that we might need to follow-up with Dr. Paulla Dolly, if her pain continues to get an x-ray.

## 2018-09-05 ENCOUNTER — Ambulatory Visit: Payer: Medicare Other

## 2018-09-05 DIAGNOSIS — M722 Plantar fascial fibromatosis: Secondary | ICD-10-CM

## 2018-09-07 DIAGNOSIS — E7800 Pure hypercholesterolemia, unspecified: Secondary | ICD-10-CM | POA: Diagnosis not present

## 2018-09-12 NOTE — Progress Notes (Signed)
Katrina Evans is here today with complaint about pain in her right heel.  She has had this pain off and on for approximately 3 years.  She is tried various modalities such as injections, anti-inflammatories, and stretching.  Nothing has helped relieve her pain. She has noticed an improvement but she fell  A couple weeks ago and her pain has returned.   Pain on palpation to the right plantar heel at insertion the calcaneus.  Pain when palpating mid to medial plantar fascial band.  ESWT administered for 5 J and tolerated well.  Advised patient on avoidance of NSAIDs and ice, advised her to utilize her boot posttreatment.  She is to follow-up prn with Dr Paulla Dolly if there is no imprvement.

## 2018-10-01 ENCOUNTER — Ambulatory Visit (INDEPENDENT_AMBULATORY_CARE_PROVIDER_SITE_OTHER): Payer: Medicare Other | Admitting: Podiatry

## 2018-10-01 ENCOUNTER — Encounter: Payer: Self-pay | Admitting: Podiatry

## 2018-10-01 DIAGNOSIS — M722 Plantar fascial fibromatosis: Secondary | ICD-10-CM

## 2018-10-03 NOTE — Progress Notes (Signed)
Subjective:   Patient ID: Katrina Evans, female   DOB: 67 y.o.   MRN: [id number]   HPI Patient states she seems to be doing a little better but still has some discomfort in her heel but not as intense it was was previously before we did shockwave   ROS      Objective:  Physical Exam  Neurovascular status intact with patient's plantar heel being mildly discomforting but improved from where it was previously     Assessment:  Improved fasciitis-like symptoms right     Plan:  I have advised on anti-inflammatories ice therapy boot usage to be used on as-needed basis and will give it 6 more weeks and then reevaluate and may require further shockwave for possible ending up requiring open cutting surgery

## 2018-10-10 ENCOUNTER — Other Ambulatory Visit: Payer: Self-pay | Admitting: Physician Assistant

## 2018-10-10 DIAGNOSIS — Z1231 Encounter for screening mammogram for malignant neoplasm of breast: Secondary | ICD-10-CM

## 2018-11-05 DIAGNOSIS — M170 Bilateral primary osteoarthritis of knee: Secondary | ICD-10-CM | POA: Diagnosis not present

## 2018-11-05 DIAGNOSIS — M25562 Pain in left knee: Secondary | ICD-10-CM | POA: Diagnosis not present

## 2018-11-05 DIAGNOSIS — M1711 Unilateral primary osteoarthritis, right knee: Secondary | ICD-10-CM | POA: Diagnosis not present

## 2018-11-05 DIAGNOSIS — M25561 Pain in right knee: Secondary | ICD-10-CM | POA: Diagnosis not present

## 2018-11-06 DIAGNOSIS — M25562 Pain in left knee: Secondary | ICD-10-CM | POA: Diagnosis not present

## 2018-11-06 DIAGNOSIS — M1712 Unilateral primary osteoarthritis, left knee: Secondary | ICD-10-CM | POA: Diagnosis not present

## 2018-11-12 ENCOUNTER — Ambulatory Visit: Payer: Medicare Other | Admitting: Podiatry

## 2018-11-12 DIAGNOSIS — M25561 Pain in right knee: Secondary | ICD-10-CM | POA: Diagnosis not present

## 2018-11-12 DIAGNOSIS — M1711 Unilateral primary osteoarthritis, right knee: Secondary | ICD-10-CM | POA: Diagnosis not present

## 2018-11-13 DIAGNOSIS — M1712 Unilateral primary osteoarthritis, left knee: Secondary | ICD-10-CM | POA: Diagnosis not present

## 2018-11-13 DIAGNOSIS — M25562 Pain in left knee: Secondary | ICD-10-CM | POA: Diagnosis not present

## 2018-11-14 ENCOUNTER — Ambulatory Visit: Payer: Medicare Other | Admitting: Podiatry

## 2018-11-19 ENCOUNTER — Ambulatory Visit: Payer: Medicare Other

## 2018-11-20 DIAGNOSIS — R635 Abnormal weight gain: Secondary | ICD-10-CM | POA: Diagnosis not present

## 2018-11-20 DIAGNOSIS — E785 Hyperlipidemia, unspecified: Secondary | ICD-10-CM | POA: Diagnosis not present

## 2018-11-20 DIAGNOSIS — N951 Menopausal and female climacteric states: Secondary | ICD-10-CM | POA: Diagnosis not present

## 2018-11-20 DIAGNOSIS — E559 Vitamin D deficiency, unspecified: Secondary | ICD-10-CM | POA: Diagnosis not present

## 2018-11-20 DIAGNOSIS — M25561 Pain in right knee: Secondary | ICD-10-CM | POA: Diagnosis not present

## 2018-11-20 DIAGNOSIS — M1711 Unilateral primary osteoarthritis, right knee: Secondary | ICD-10-CM | POA: Diagnosis not present

## 2018-11-21 ENCOUNTER — Encounter: Payer: Self-pay | Admitting: Podiatry

## 2018-11-21 ENCOUNTER — Ambulatory Visit (INDEPENDENT_AMBULATORY_CARE_PROVIDER_SITE_OTHER): Payer: Medicare Other | Admitting: Podiatry

## 2018-11-21 DIAGNOSIS — M722 Plantar fascial fibromatosis: Secondary | ICD-10-CM

## 2018-11-21 MED ORDER — TRIAMCINOLONE ACETONIDE 10 MG/ML IJ SUSP
10.0000 mg | Freq: Once | INTRAMUSCULAR | Status: AC
  Administered 2018-11-21: 10 mg

## 2018-11-22 ENCOUNTER — Ambulatory Visit
Admission: RE | Admit: 2018-11-22 | Discharge: 2018-11-22 | Disposition: A | Discharge status: Home / Self Care | Payer: Medicare Other | Source: Ambulatory Visit | Attending: Physician Assistant | Admitting: Physician Assistant

## 2018-11-22 DIAGNOSIS — Z1231 Encounter for screening mammogram for malignant neoplasm of breast: Secondary | ICD-10-CM | POA: Diagnosis not present

## 2018-11-22 NOTE — Progress Notes (Signed)
Subjective:   Patient ID: Katrina Evans, female   DOB: 68 y.o.   MRN: [id number]   HPI Patient states she is doing a little better with pain still present upon deep palpation but seems to be making gradual progress   ROS      Objective:  Physical Exam  Neurovascular status intact with discomfort still medial aspect of the plantar fascial right that is less intense than previous with one area but still quite sore upon deep palpation     Assessment:  Plantar fasciitis with improvement with shockwave with one area of continued discomfort     Plan:  Katrina Evans to continue to work with this conservatively No more shockwave or surgical intervention may be necessary at one point.  Today I went ahead and I did do sterile prep and injected the plantar fascia 3 mg Kenalog 5 mg Xylocaine and advised on boot therapy as needed.  Patient will be checked back 1 month or earlier if needed

## 2018-11-26 DIAGNOSIS — Z6834 Body mass index (BMI) 34.0-34.9, adult: Secondary | ICD-10-CM | POA: Diagnosis not present

## 2018-11-26 DIAGNOSIS — E663 Overweight: Secondary | ICD-10-CM | POA: Diagnosis not present

## 2018-11-26 DIAGNOSIS — M25562 Pain in left knee: Secondary | ICD-10-CM | POA: Diagnosis not present

## 2018-11-26 DIAGNOSIS — E785 Hyperlipidemia, unspecified: Secondary | ICD-10-CM | POA: Diagnosis not present

## 2018-11-26 DIAGNOSIS — Z1331 Encounter for screening for depression: Secondary | ICD-10-CM | POA: Diagnosis not present

## 2018-11-26 DIAGNOSIS — M1712 Unilateral primary osteoarthritis, left knee: Secondary | ICD-10-CM | POA: Diagnosis not present

## 2018-11-26 DIAGNOSIS — N951 Menopausal and female climacteric states: Secondary | ICD-10-CM | POA: Diagnosis not present

## 2018-11-26 DIAGNOSIS — E559 Vitamin D deficiency, unspecified: Secondary | ICD-10-CM | POA: Diagnosis not present

## 2018-11-30 DIAGNOSIS — R1310 Dysphagia, unspecified: Secondary | ICD-10-CM | POA: Diagnosis not present

## 2018-11-30 DIAGNOSIS — E559 Vitamin D deficiency, unspecified: Secondary | ICD-10-CM | POA: Diagnosis not present

## 2018-11-30 DIAGNOSIS — K449 Diaphragmatic hernia without obstruction or gangrene: Secondary | ICD-10-CM | POA: Diagnosis not present

## 2018-11-30 DIAGNOSIS — K219 Gastro-esophageal reflux disease without esophagitis: Secondary | ICD-10-CM | POA: Diagnosis not present

## 2018-12-03 ENCOUNTER — Other Ambulatory Visit: Payer: Self-pay | Admitting: Physician Assistant

## 2018-12-03 DIAGNOSIS — M25561 Pain in right knee: Secondary | ICD-10-CM | POA: Diagnosis not present

## 2018-12-03 DIAGNOSIS — M1711 Unilateral primary osteoarthritis, right knee: Secondary | ICD-10-CM | POA: Diagnosis not present

## 2018-12-03 DIAGNOSIS — R1310 Dysphagia, unspecified: Secondary | ICD-10-CM

## 2018-12-04 DIAGNOSIS — M1711 Unilateral primary osteoarthritis, right knee: Secondary | ICD-10-CM | POA: Diagnosis not present

## 2018-12-04 DIAGNOSIS — M25561 Pain in right knee: Secondary | ICD-10-CM | POA: Diagnosis not present

## 2018-12-06 ENCOUNTER — Ambulatory Visit
Admission: RE | Admit: 2018-12-06 | Discharge: 2018-12-06 | Disposition: A | Discharge status: Home / Self Care | Payer: Medicare Other | Source: Ambulatory Visit | Attending: Physician Assistant | Admitting: Physician Assistant

## 2018-12-06 DIAGNOSIS — K449 Diaphragmatic hernia without obstruction or gangrene: Secondary | ICD-10-CM | POA: Diagnosis not present

## 2018-12-06 DIAGNOSIS — R1310 Dysphagia, unspecified: Secondary | ICD-10-CM

## 2018-12-10 DIAGNOSIS — M25561 Pain in right knee: Secondary | ICD-10-CM | POA: Diagnosis not present

## 2018-12-10 DIAGNOSIS — M1711 Unilateral primary osteoarthritis, right knee: Secondary | ICD-10-CM | POA: Diagnosis not present

## 2018-12-11 DIAGNOSIS — M1712 Unilateral primary osteoarthritis, left knee: Secondary | ICD-10-CM | POA: Diagnosis not present

## 2018-12-11 DIAGNOSIS — M25562 Pain in left knee: Secondary | ICD-10-CM | POA: Diagnosis not present

## 2018-12-19 ENCOUNTER — Ambulatory Visit: Payer: Medicare Other | Admitting: Podiatry

## 2018-12-24 DIAGNOSIS — L821 Other seborrheic keratosis: Secondary | ICD-10-CM | POA: Diagnosis not present

## 2018-12-24 DIAGNOSIS — L720 Epidermal cyst: Secondary | ICD-10-CM | POA: Diagnosis not present

## 2018-12-24 DIAGNOSIS — I788 Other diseases of capillaries: Secondary | ICD-10-CM | POA: Diagnosis not present

## 2018-12-24 DIAGNOSIS — L814 Other melanin hyperpigmentation: Secondary | ICD-10-CM | POA: Diagnosis not present

## 2019-03-04 DIAGNOSIS — E559 Vitamin D deficiency, unspecified: Secondary | ICD-10-CM | POA: Diagnosis not present

## 2019-05-09 DIAGNOSIS — M1711 Unilateral primary osteoarthritis, right knee: Secondary | ICD-10-CM | POA: Diagnosis not present

## 2019-05-09 DIAGNOSIS — M25571 Pain in right ankle and joints of right foot: Secondary | ICD-10-CM | POA: Diagnosis not present

## 2019-05-16 DIAGNOSIS — M79671 Pain in right foot: Secondary | ICD-10-CM | POA: Diagnosis not present

## 2019-05-20 DIAGNOSIS — M25571 Pain in right ankle and joints of right foot: Secondary | ICD-10-CM | POA: Diagnosis not present

## 2019-05-20 DIAGNOSIS — M1711 Unilateral primary osteoarthritis, right knee: Secondary | ICD-10-CM | POA: Diagnosis not present

## 2019-06-13 DIAGNOSIS — M25562 Pain in left knee: Secondary | ICD-10-CM | POA: Diagnosis not present

## 2019-06-13 DIAGNOSIS — M170 Bilateral primary osteoarthritis of knee: Secondary | ICD-10-CM | POA: Diagnosis not present

## 2019-06-13 DIAGNOSIS — M25561 Pain in right knee: Secondary | ICD-10-CM | POA: Diagnosis not present

## 2019-06-13 DIAGNOSIS — M1711 Unilateral primary osteoarthritis, right knee: Secondary | ICD-10-CM | POA: Diagnosis not present

## 2019-06-19 DIAGNOSIS — M25551 Pain in right hip: Secondary | ICD-10-CM | POA: Diagnosis not present

## 2019-06-19 DIAGNOSIS — M1711 Unilateral primary osteoarthritis, right knee: Secondary | ICD-10-CM | POA: Diagnosis not present

## 2019-06-19 DIAGNOSIS — M25521 Pain in right elbow: Secondary | ICD-10-CM | POA: Diagnosis not present

## 2019-06-19 DIAGNOSIS — M1712 Unilateral primary osteoarthritis, left knee: Secondary | ICD-10-CM | POA: Diagnosis not present

## 2019-06-19 DIAGNOSIS — M79671 Pain in right foot: Secondary | ICD-10-CM | POA: Diagnosis not present

## 2019-06-19 DIAGNOSIS — M25562 Pain in left knee: Secondary | ICD-10-CM | POA: Diagnosis not present

## 2019-06-20 DIAGNOSIS — M25561 Pain in right knee: Secondary | ICD-10-CM | POA: Diagnosis not present

## 2019-06-20 DIAGNOSIS — M1711 Unilateral primary osteoarthritis, right knee: Secondary | ICD-10-CM | POA: Diagnosis not present

## 2019-06-24 DIAGNOSIS — Z86010 Personal history of colonic polyps: Secondary | ICD-10-CM | POA: Diagnosis not present

## 2019-06-24 DIAGNOSIS — Z0000 Encounter for general adult medical examination without abnormal findings: Secondary | ICD-10-CM | POA: Diagnosis not present

## 2019-06-24 DIAGNOSIS — I10 Essential (primary) hypertension: Secondary | ICD-10-CM | POA: Diagnosis not present

## 2019-06-24 DIAGNOSIS — K219 Gastro-esophageal reflux disease without esophagitis: Secondary | ICD-10-CM | POA: Diagnosis not present

## 2019-06-24 DIAGNOSIS — E559 Vitamin D deficiency, unspecified: Secondary | ICD-10-CM | POA: Diagnosis not present

## 2019-06-24 DIAGNOSIS — Z85820 Personal history of malignant melanoma of skin: Secondary | ICD-10-CM | POA: Diagnosis not present

## 2019-06-24 DIAGNOSIS — E7800 Pure hypercholesterolemia, unspecified: Secondary | ICD-10-CM | POA: Diagnosis not present

## 2019-06-26 DIAGNOSIS — R8290 Unspecified abnormal findings in urine: Secondary | ICD-10-CM | POA: Diagnosis not present

## 2019-06-26 DIAGNOSIS — Z131 Encounter for screening for diabetes mellitus: Secondary | ICD-10-CM | POA: Diagnosis not present

## 2019-06-26 DIAGNOSIS — E559 Vitamin D deficiency, unspecified: Secondary | ICD-10-CM | POA: Diagnosis not present

## 2019-06-26 DIAGNOSIS — Z0000 Encounter for general adult medical examination without abnormal findings: Secondary | ICD-10-CM | POA: Diagnosis not present

## 2019-06-26 DIAGNOSIS — Z23 Encounter for immunization: Secondary | ICD-10-CM | POA: Diagnosis not present

## 2019-06-26 DIAGNOSIS — E7800 Pure hypercholesterolemia, unspecified: Secondary | ICD-10-CM | POA: Diagnosis not present

## 2019-06-27 ENCOUNTER — Encounter: Payer: Self-pay | Admitting: Podiatry

## 2019-06-27 ENCOUNTER — Ambulatory Visit (INDEPENDENT_AMBULATORY_CARE_PROVIDER_SITE_OTHER): Payer: Medicare Other | Admitting: Podiatry

## 2019-06-27 ENCOUNTER — Other Ambulatory Visit: Payer: Self-pay

## 2019-06-27 DIAGNOSIS — M1712 Unilateral primary osteoarthritis, left knee: Secondary | ICD-10-CM | POA: Diagnosis not present

## 2019-06-27 DIAGNOSIS — R600 Localized edema: Secondary | ICD-10-CM

## 2019-06-27 DIAGNOSIS — M722 Plantar fascial fibromatosis: Secondary | ICD-10-CM

## 2019-06-27 DIAGNOSIS — M25562 Pain in left knee: Secondary | ICD-10-CM | POA: Diagnosis not present

## 2019-06-28 NOTE — Progress Notes (Signed)
Subjective:   Patient ID: Katrina Evans, female   DOB: 68 y.o.   MRN: [id number]   HPI Patient states overall she has been doing well with her heel but she has developed some discomfort in the plantar lateral aspect of the heel and into the medial and central band to a mild degree but is doing much better that she was after having shockwave therapy   ROS      Objective:  Physical Exam  Neurovascular status intact with discomfort of a mild to moderate nature in the lateral plantar aspect of the heel more proximal and also into the central band     Assessment:  Possible low-grade reoccurrence plantar fasciitis right lateral and central band     Plan:  Sterile prep and injected to the lateral side 3 mg Kenalog 5 mg Xylocaine to try to reduce this repeated inflammation and advised on supportive shoe gear the patient will be seen back as needed

## 2019-07-22 DIAGNOSIS — E7800 Pure hypercholesterolemia, unspecified: Secondary | ICD-10-CM | POA: Diagnosis not present

## 2019-07-22 DIAGNOSIS — I10 Essential (primary) hypertension: Secondary | ICD-10-CM | POA: Diagnosis not present

## 2019-08-12 DIAGNOSIS — H524 Presbyopia: Secondary | ICD-10-CM | POA: Diagnosis not present

## 2019-08-12 DIAGNOSIS — H2513 Age-related nuclear cataract, bilateral: Secondary | ICD-10-CM | POA: Diagnosis not present

## 2019-08-21 ENCOUNTER — Other Ambulatory Visit: Payer: Self-pay

## 2019-08-21 DIAGNOSIS — I83893 Varicose veins of bilateral lower extremities with other complications: Secondary | ICD-10-CM

## 2019-08-28 ENCOUNTER — Telehealth (HOSPITAL_COMMUNITY): Payer: Self-pay | Admitting: *Deleted

## 2019-08-28 NOTE — Telephone Encounter (Signed)

## 2019-08-29 ENCOUNTER — Ambulatory Visit (INDEPENDENT_AMBULATORY_CARE_PROVIDER_SITE_OTHER): Payer: Medicare Other | Admitting: Family

## 2019-08-29 ENCOUNTER — Ambulatory Visit (HOSPITAL_COMMUNITY)
Admission: RE | Admit: 2019-08-29 | Discharge: 2019-08-29 | Disposition: A | Discharge status: Home / Self Care | Payer: Medicare Other | Source: Ambulatory Visit | Attending: Family | Admitting: Family

## 2019-08-29 ENCOUNTER — Other Ambulatory Visit: Payer: Self-pay

## 2019-08-29 ENCOUNTER — Encounter: Payer: Self-pay | Admitting: Family

## 2019-08-29 VITALS — BP 135/74 | HR 74 | Temp 97.5°F | Resp 20 | Ht 63.5 in | Wt 193.0 lb

## 2019-08-29 DIAGNOSIS — I83893 Varicose veins of bilateral lower extremities with other complications: Secondary | ICD-10-CM | POA: Diagnosis not present

## 2019-08-29 DIAGNOSIS — I872 Venous insufficiency (chronic) (peripheral): Secondary | ICD-10-CM

## 2019-08-29 NOTE — Patient Instructions (Signed)
To decrease swelling in your feet and legs: Elevate feet above slightly bent knees, feet above heart, overnight and 3-4 times per day for 20 minutes.    Chronic Venous Insufficiency Chronic venous insufficiency is a condition where the leg veins cannot effectively pump blood from the legs to the heart. This happens when the vein walls are either stretched, weakened, or damaged, or when the valves inside the vein are damaged. With the right treatment, you should be able to continue with an active life. This condition is also called venous stasis. What are the causes? Common causes of this condition include:  High blood pressure inside the veins (venous hypertension).  Sitting or standing too long, causing increased blood pressure in the leg veins.  A blood clot that blocks blood flow in a vein (deep vein thrombosis, DVT).  Inflammation of a vein (phlebitis) that causes a blood clot to form.  Tumors in the pelvis that cause blood to back up. What increases the risk? The following factors may make you more likely to develop this condition:  Having a family history of this condition.  Obesity.  Pregnancy.  Living without enough regular physical activity or exercise (sedentary lifestyle).  Smoking.  Having a job that requires long periods of standing or sitting in one place.  Being a certain age. Women in their 20s and 30s and men in their 58s are more likely to develop this condition. What are the signs or symptoms? Symptoms of this condition include:  Veins that are enlarged, bulging, or twisted (varicose veins).  Skin breakdown or ulcers.  Reddened skin or dark discoloration of skin on the leg between the knee and ankle.  Brown, smooth, tight, and painful skin just above the ankle, usually on the inside of the leg (lipodermatosclerosis).  Swelling of the legs. How is this diagnosed? This condition may be diagnosed based on:  Your medical history.  A physical  exam.  Tests, such as: ? A procedure that creates an image of a blood vessel and nearby organs and provides information about blood flow through the blood vessel (duplex ultrasound). ? A procedure that tests blood flow (plethysmography). ? A procedure that looks at the veins using X-ray and dye (venogram). How is this treated? The goals of treatment are to help you return to an active life and to minimize pain or disability. Treatment depends on the severity of your condition, and it may include:  Wearing compression stockings. These can help relieve symptoms and help prevent your condition from getting worse. However, they do not cure the condition.  Sclerotherapy. This procedure involves an injection of a solution that shrinks damaged veins.  Surgery. This may involve: ? Removing a diseased vein (vein stripping). ? Cutting off blood flow through the vein (laser ablation surgery). ? Repairing or reconstructing a valve within the affected vein. Follow these instructions at home:      Wear compression stockings as told by your health care provider. These stockings help to prevent blood clots and reduce swelling in your legs.  Take over-the-counter and prescription medicines only as told by your health care provider.  Stay active by exercising, walking, or doing different activities. Ask your health care provider what activities are safe for you and how much exercise you need.  Drink enough fluid to keep your urine pale yellow.  Do not use any products that contain nicotine or tobacco, such as cigarettes, e-cigarettes, and chewing tobacco. If you need help quitting, ask your health care provider.  Keep all follow-up visits as told by your health care provider. This is important. Contact a health care provider if you:  Have redness, swelling, or more pain in the affected area.  See a red streak or line that goes up or down from the affected area.  Have skin breakdown or skin loss  in the affected area, even if the breakdown is small.  Get an injury in the affected area. Get help right away if:  You get an injury and an open wound in the affected area.  You have: ? Severe pain that does not get better with medicine. ? Sudden numbness or weakness in the foot or ankle below the affected area. ? Trouble moving your foot or ankle. ? A fever. ? Worse or persistent symptoms. ? Chest pain. ? Shortness of breath. Summary  Chronic venous insufficiency is a condition where the leg veins cannot effectively pump blood from the legs to the heart.  Chronic venous insufficiency occurs when the vein walls become stretched, weakened, or damaged, or when valves within the vein are damaged.  Treatment depends on how severe your condition is. It often involves wearing compression stockings and may involve having a procedure.  Make sure you stay active by exercising, walking, or doing different activities. Ask your health care provider what activities are safe for you and how much exercise you need. This information is not intended to replace advice given to you by your health care provider. Make sure you discuss any questions you have with your health care provider. Document Released: 02/13/2007 Document Revised: 07/03/2018 Document Reviewed: 07/03/2018 Elsevier Patient Education  2020 [employer].

## 2019-08-29 NOTE — Progress Notes (Signed)
Referred by:  Lennie Odor, PA-C [address] [place],  [place] [address]  Reason for referral: heaviness and pain in both legs that wakes her up at night for the last 6-77-months   History of Present Illness  Katrina Evans is a 68 y.o. (01-18-1951) female who presents with chief complaint: heaviness and pain in both legs that wakes her up at night for the last 6-90-months.The patient has had no history of DVT,  history of 3 pregnancies, history of varicose veins x 10 years, no history of venous stasis ulcers, no history of  Lymphedema and no history of skin changes in lower legs.  There is no known family history of venous disorders.  The patient has used knee high compression stockings in the past, but only after her right GSV ablation, for about 2-4 weeks.   She fractured her right foot in 2016, has had plantar fascitis in her right foot since then, and has not walked much. She fractured her left ankle in 2017, and this limited her walking also.  She states she has had heaviness in her lower legs since she fracture her right foot.   She states that she had a laser procedure years ago by Dr. Franchot Mimes at [facility] in [place] for "achey and heavy legs".   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  She had "flexogenics" injections in both knees for arthritis pain, last time was in June 2020.   Pt states that her orthopod on [place] her that she has a Baker's cyst in the right knee only, not left knee.  She smoked for 32 years, quit about 2009.  She states that she does not have DM.   Past Medical History:  Diagnosis Date  . Hypertension     Past Surgical History:  Procedure Laterality Date  . BREAST BIOPSY Right    No Scar  . LAPAROSCOPIC APPENDECTOMY N/A 08/17/2017   Procedure: APPENDECTOMY LAPAROSCOPIC;  Surgeon: Excell Seltzer, MD;  Location: [facility];  Service: General;  Laterality: N/A;  . laser vein  surgery      Social History   Socioeconomic History  . Marital status: Single    Spouse name: Not on file  . Number of children: Not on file  . Years of education: Not on file  . Highest education level: Not on file  Occupational History  . Not on file  Social Needs  . Financial resource strain: Not on file  . Food insecurity    Worry: Not on file    Inability: Not on file  . Transportation needs    Medical: Not on file    Non-medical: Not on file  Tobacco Use  . Smoking status: Former Research scientist (life sciences)  . Smokeless tobacco: Never Used  Substance and Sexual Activity  . Alcohol use: Yes    Comment: once yearly  . Drug use: No  . Sexual activity: Not on file  Lifestyle  . Physical activity    Days per week: Not on file    Minutes per session: Not on file  . Stress: Not on file  Relationships  . Social Herbalist on phone: Not on file    Gets together: Not on file    Attends religious service: Not on file    Active member of club or organization: Not on file    Attends meetings of clubs or organizations: Not on  file    Relationship status: Not on file  . Intimate partner violence    Fear of current or ex partner: Not on file    Emotionally abused: Not on file    Physically abused: Not on file    Forced sexual activity: Not on file  Other Topics Concern  . Not on file  Social History Narrative  . Not on file    Family History  Problem Relation Age of Onset  . Breast cancer Neg Hx     Current Outpatient Medications on File Prior to Visit  Medication Sig Dispense Refill  . acetaminophen (TYLENOL) 500 MG tablet Take 1-2 tablets (500-1,000 mg total) by mouth every 6 (six) hours as needed for mild pain, moderate pain, fever or headache. 30 tablet 0  . ergocalciferol (VITAMIN D2) 1.25 MG (50000 UT) capsule Take 50,000 Units by mouth once a week.    . hydrochlorothiazide (HYDRODIURIL) 25 MG tablet TK 1 T PO QD IN THE MORNING FOR HIGH BP    . ibuprofen (ADVIL,MOTRIN)  200 MG tablet He can take 2-3 tablets every 6 hours as needed for pain.  You can also alternate this with plain Tylenol for pain.  You can buy this over-the-counter at any drugstore without a prescription.     No current facility-administered medications on file prior to visit.     Allergies  Allergen Reactions  . Diclofenac Nausea Only    Pt stated, "Upset my stomach"    REVIEW OF SYSTEMS: Cardiovascular: No chest pain, chest pressure, + occasional palpitations, no orthopnea, or dyspnea on exertion. No claudication or rest pain,  No history of DVT or phlebitis. Pulmonary: No productive cough, asthma or wheezing. Neurologic: No weakness, paresthesias, aphasia, or amaurosis. No dizziness. Hematologic: No bleeding problems or clotting disorders. Musculoskeletal: + pain and swelling in both knees, flexogenic injections in June 2020 did not help much Gastrointestinal: No blood in stool or hematemesis Genitourinary: No dysuria, + history of hematuria, no etiology found Psychiatric:: No history of major depression. Integumentary: No rashes or ulcers. Had melanoma removed from sole of right foot about 2018 Constitutional: No fever or chills.  Physical Examination Vitals:   08/29/19 1440  BP: 135/74  Pulse: 74  Resp: 20  Temp: (!) 97.5 F (36.4 C)  SpO2: 96%  Weight: 193 lb (87.5 kg)  Height: 5' 3.5" (1.613 m)   Body mass index is 33.65 kg/m.  PHYSICAL EXAMINATION: General: Obese female in NAD  HEENT:  No gross abnormalities Pulmonary: Respirations are non-labored Abdomen: Soft and non-tender with. Musculoskeletal: There are no major deformities.   Neurologic: No focal weakness or paresthesias are detected, Skin: There are no ulcer or rashes noted. Psychiatric: The patient has normal affect. Cardiovascular: There is a regular rate and rhythm without significant murmur appreciated.   Vascular: Vessel Right Left  Radial 2+Palpable 2+Palpable  Carotid  without bruit  without  bruit  Aorta Not palpable N/A  Femoral 2+Palpable 2+Palpable  Popliteal 2+ palpable Not palpable  PT 3+Palpable 3+Palpable  DP 2+Palpable 2+Palpable    Non-Invasive Vascular Imaging  BLE Venous Duplex (Date: 08/29/2019):  Venous Reflux Times Normal value < 0.5 sec +--------------+------+---------+--------+--------+ RIGHT         RefluxReflux NoDiameterComments                Yes                            +--------------+------+---------+--------+--------+ GSV  at [id number] [facility]                     0.57           +--------------+------+---------+--------+--------+ GSV prox thigh                       NV       +--------------+------+---------+--------+--------+ GSV mid thigh                        NV       +--------------+------+---------+--------+--------+ GSV dist thigh                       NV       +--------------+------+---------+--------+--------+ GSV at knee                          NV       +--------------+------+---------+--------+--------+ GSV prox calf                        NV       +--------------+------+---------+--------+--------+ GSV mid calf                   0.14           +--------------+------+---------+--------+--------+ GSV dist calf                  0.14           +--------------+------+---------+--------+--------+ SSV Pop Fossa                  0.16           +--------------+------+---------+--------+--------+ SSV prox calf                  0.14           +--------------+------+---------+--------+--------+ SSV mid calf                   0.27           +--------------+------+---------+--------+--------+    +--------------+------+---------+--------+--------+ LEFT          RefluxReflux NoDiameterComments                Yes                            +--------------+------+---------+--------+--------+ GSV at SFJ                     0.61            +--------------+------+---------+--------+--------+ GSV prox thigh                 0.48           +--------------+------+---------+--------+--------+ GSV mid thigh                  0.15           +--------------+------+---------+--------+--------+ GSV dist thigh                 0.23           +--------------+------+---------+--------+--------+ GSV at knee                          NV       +--------------+------+---------+--------+--------+ GSV prox calf  0.25  4115 ms  +--------------+------+---------+--------+--------+ GSV mid calf                   0.32           +--------------+------+---------+--------+--------+ SSV Pop Fossa                  0.29           +--------------+------+---------+--------+--------+ SSV prox calf                  0.24           +--------------+------+---------+--------+--------+ SSV mid calf                   0.23           +--------------+------+---------+--------+--------+ Summary: Right: There is no evidence of deep vein thrombosis in the lower extremity from the common femoral vein to the popliteal vein. The Greater saphenous vein not visualized, possible closed due to prior procedure. No reflux in the small saphenous vein. Fluid visualized at the medial knee area approximately 3.74 x 1.79 cms. Left: There is no evidence of deep vein thrombosis in the lower extremity from the common femoral vein to the popliteal vein. Reflux in the greater saphenous vein in the proximal calf. No reflux in the small saphenous vein.Fluid noted in the proximal calf measuring approximately 2.71 cm x 0.49 cms.    Medical Decision Making  Analeia Leiphart is a 68 y.o. female who presents with: heaviness and pain in both legs that wakes her up at night for the last 6-38-months. Pt states that her orthopod told her that she has a Baker's cyst in her right knee, but not in her left knee. On venous duplex of  both legs today, there is evidence of fluid proximal to both knees.   Bilateral lower extremity venous duplex today shows the following: Right: There is no evidence of deep vein thrombosis in the lower extremity from the common femoral vein to the popliteal vein. The Greater saphenous vein not visualized, possible closed due to prior procedure. No reflux in the small saphenous vein. Fluid visualized at the medial knee area approximately 3.74 x 1.79 cms. Left: There is no evidence of deep vein thrombosis in the lower extremity from the common femoral vein to the popliteal vein. Reflux in the greater saphenous vein in the proximal calf. No reflux in the small saphenous vein. Fluid noted in the proximal calf measuring approximately 2.71 cm x 0.49 cms.  The pain and heaviness in her lower legs that she has been experiencing for the last 6-8 months is not related to venous nor arterial issues. She has palpable pedal pulses: no arterial insufficiency.  It appears that her right greater saphenous vein has been ablated, no venous reflux in the right leg.  She does have reflux in the greater saphenous vein in the proximal calf. However, the symptoms in her lower legs are equal.  She does have fluid collections proximal to both knees, which is a possible etiology of her pain. I advised her to see her orthopod re this. I discussed this with Dr. Scot Dock, and he agrees that her pain is most likely of an orthopedic nature.    She does have some left leg venous reflux, no edema in either leg.  See below.    Based on the patient's history and examination, I recommend: compression hose, elevation of legs, avoid prolonged periods of sitting and standing, and regular exercise.  I discussed with the patient the use of her 20-30 mm thigh high compression stockings and need for 3 month trial of such.  The patient will follow up in 3 months with Dr. Scot Dock or Dr. Oneida Alar in the [facility] for evaluation for: further  treatment of chronic venous insuficiency.  Thank you for allowing [place] to participate in this patient's care.  Clemon Chambers, RN, MSN, FNP-C [facility] Office: [phone]  08/29/2019, 2:45 PM  Clinic MD: Scot Dock in vein procedures

## 2019-12-03 ENCOUNTER — Other Ambulatory Visit: Payer: Self-pay | Admitting: Physician Assistant

## 2019-12-03 DIAGNOSIS — Z1231 Encounter for screening mammogram for malignant neoplasm of breast: Secondary | ICD-10-CM

## 2019-12-12 ENCOUNTER — Ambulatory Visit: Payer: Medicare Other | Admitting: Vascular Surgery

## 2019-12-21 DIAGNOSIS — Z0100 Encounter for examination of eyes and vision without abnormal findings: Secondary | ICD-10-CM | POA: Diagnosis not present

## 2019-12-31 DIAGNOSIS — L812 Freckles: Secondary | ICD-10-CM | POA: Diagnosis not present

## 2019-12-31 DIAGNOSIS — D1801 Hemangioma of skin and subcutaneous tissue: Secondary | ICD-10-CM | POA: Diagnosis not present

## 2019-12-31 DIAGNOSIS — D225 Melanocytic nevi of trunk: Secondary | ICD-10-CM | POA: Diagnosis not present

## 2019-12-31 DIAGNOSIS — Z85820 Personal history of malignant melanoma of skin: Secondary | ICD-10-CM | POA: Diagnosis not present

## 2019-12-31 DIAGNOSIS — L821 Other seborrheic keratosis: Secondary | ICD-10-CM | POA: Diagnosis not present

## 2020-01-10 ENCOUNTER — Other Ambulatory Visit: Payer: Self-pay

## 2020-01-10 ENCOUNTER — Ambulatory Visit
Admission: RE | Admit: 2020-01-10 | Discharge: 2020-01-10 | Disposition: A | Discharge status: Home / Self Care | Payer: Medicare Other | Source: Ambulatory Visit | Attending: Physician Assistant | Admitting: Physician Assistant

## 2020-01-10 DIAGNOSIS — Z1231 Encounter for screening mammogram for malignant neoplasm of breast: Secondary | ICD-10-CM | POA: Diagnosis not present

## 2020-01-14 ENCOUNTER — Other Ambulatory Visit: Payer: Self-pay | Admitting: Physician Assistant

## 2020-01-14 DIAGNOSIS — R928 Other abnormal and inconclusive findings on diagnostic imaging of breast: Secondary | ICD-10-CM

## 2020-01-20 DIAGNOSIS — E559 Vitamin D deficiency, unspecified: Secondary | ICD-10-CM | POA: Diagnosis not present

## 2020-01-20 DIAGNOSIS — M8580 Other specified disorders of bone density and structure, unspecified site: Secondary | ICD-10-CM | POA: Diagnosis not present

## 2020-01-20 DIAGNOSIS — I10 Essential (primary) hypertension: Secondary | ICD-10-CM | POA: Diagnosis not present

## 2020-01-20 DIAGNOSIS — E7800 Pure hypercholesterolemia, unspecified: Secondary | ICD-10-CM | POA: Diagnosis not present

## 2020-01-20 DIAGNOSIS — I872 Venous insufficiency (chronic) (peripheral): Secondary | ICD-10-CM | POA: Diagnosis not present

## 2020-01-20 DIAGNOSIS — Z85820 Personal history of malignant melanoma of skin: Secondary | ICD-10-CM | POA: Diagnosis not present

## 2020-01-27 DIAGNOSIS — R635 Abnormal weight gain: Secondary | ICD-10-CM | POA: Diagnosis not present

## 2020-01-27 DIAGNOSIS — E785 Hyperlipidemia, unspecified: Secondary | ICD-10-CM | POA: Diagnosis not present

## 2020-01-27 DIAGNOSIS — N951 Menopausal and female climacteric states: Secondary | ICD-10-CM | POA: Diagnosis not present

## 2020-01-29 ENCOUNTER — Ambulatory Visit
Admission: RE | Admit: 2020-01-29 | Discharge: 2020-01-29 | Disposition: A | Discharge status: Home / Self Care | Payer: Medicare HMO | Source: Ambulatory Visit | Attending: Physician Assistant | Admitting: Physician Assistant

## 2020-01-29 ENCOUNTER — Ambulatory Visit
Admission: RE | Admit: 2020-01-29 | Discharge: 2020-01-29 | Disposition: A | Discharge status: Home / Self Care | Payer: No Typology Code available for payment source | Source: Ambulatory Visit | Attending: Physician Assistant | Admitting: Physician Assistant

## 2020-01-29 ENCOUNTER — Other Ambulatory Visit: Payer: Self-pay

## 2020-01-29 DIAGNOSIS — R928 Other abnormal and inconclusive findings on diagnostic imaging of breast: Secondary | ICD-10-CM

## 2020-01-29 DIAGNOSIS — N6012 Diffuse cystic mastopathy of left breast: Secondary | ICD-10-CM | POA: Diagnosis not present

## 2020-02-05 DIAGNOSIS — Z1339 Encounter for screening examination for other mental health and behavioral disorders: Secondary | ICD-10-CM | POA: Diagnosis not present

## 2020-02-05 DIAGNOSIS — N951 Menopausal and female climacteric states: Secondary | ICD-10-CM | POA: Diagnosis not present

## 2020-02-05 DIAGNOSIS — Z72820 Sleep deprivation: Secondary | ICD-10-CM | POA: Diagnosis not present

## 2020-02-05 DIAGNOSIS — E785 Hyperlipidemia, unspecified: Secondary | ICD-10-CM | POA: Diagnosis not present

## 2020-02-05 DIAGNOSIS — R232 Flushing: Secondary | ICD-10-CM | POA: Diagnosis not present

## 2020-02-05 DIAGNOSIS — E559 Vitamin D deficiency, unspecified: Secondary | ICD-10-CM | POA: Diagnosis not present

## 2020-02-05 DIAGNOSIS — Z6834 Body mass index (BMI) 34.0-34.9, adult: Secondary | ICD-10-CM | POA: Diagnosis not present

## 2020-02-05 DIAGNOSIS — R454 Irritability and anger: Secondary | ICD-10-CM | POA: Diagnosis not present

## 2020-02-05 DIAGNOSIS — Z1331 Encounter for screening for depression: Secondary | ICD-10-CM | POA: Diagnosis not present

## 2020-02-17 ENCOUNTER — Ambulatory Visit: Payer: Medicare HMO | Attending: Internal Medicine

## 2020-02-17 ENCOUNTER — Other Ambulatory Visit: Payer: No Typology Code available for payment source

## 2020-02-17 DIAGNOSIS — Z20822 Contact with and (suspected) exposure to covid-19: Secondary | ICD-10-CM | POA: Diagnosis not present

## 2020-02-18 LAB — NOVEL CORONAVIRUS, NAA: SARS-CoV-2, NAA: NOT DETECTED

## 2020-02-18 LAB — SARS-COV-2, NAA 2 DAY TAT

## 2020-02-28 DIAGNOSIS — R7301 Impaired fasting glucose: Secondary | ICD-10-CM | POA: Diagnosis not present

## 2020-02-28 DIAGNOSIS — Z6835 Body mass index (BMI) 35.0-35.9, adult: Secondary | ICD-10-CM | POA: Diagnosis not present

## 2020-03-20 DIAGNOSIS — Z6833 Body mass index (BMI) 33.0-33.9, adult: Secondary | ICD-10-CM | POA: Diagnosis not present

## 2020-03-20 DIAGNOSIS — E559 Vitamin D deficiency, unspecified: Secondary | ICD-10-CM | POA: Diagnosis not present

## 2020-03-20 DIAGNOSIS — E785 Hyperlipidemia, unspecified: Secondary | ICD-10-CM | POA: Diagnosis not present

## 2020-03-27 DIAGNOSIS — E559 Vitamin D deficiency, unspecified: Secondary | ICD-10-CM | POA: Diagnosis not present

## 2020-03-27 DIAGNOSIS — Z6833 Body mass index (BMI) 33.0-33.9, adult: Secondary | ICD-10-CM | POA: Diagnosis not present

## 2020-04-03 DIAGNOSIS — Z6833 Body mass index (BMI) 33.0-33.9, adult: Secondary | ICD-10-CM | POA: Diagnosis not present

## 2020-04-03 DIAGNOSIS — E785 Hyperlipidemia, unspecified: Secondary | ICD-10-CM | POA: Diagnosis not present

## 2020-04-16 DIAGNOSIS — R7301 Impaired fasting glucose: Secondary | ICD-10-CM | POA: Diagnosis not present

## 2020-04-16 DIAGNOSIS — Z6833 Body mass index (BMI) 33.0-33.9, adult: Secondary | ICD-10-CM | POA: Diagnosis not present

## 2020-04-28 DIAGNOSIS — E7800 Pure hypercholesterolemia, unspecified: Secondary | ICD-10-CM | POA: Diagnosis not present

## 2020-04-30 IMAGING — MG MM DIGITAL DIAGNOSTIC UNILAT*L* W/ TOMO W/ CAD
4 series · 4 of 12 positions shown · non-contrast
Comparison: Previous exam(s).

CLINICAL DATA: Screening recall for left breast mass.

EXAM:
DIGITAL DIAGNOSTIC UNILATERAL LEFT MAMMOGRAM WITH CAD AND TOMO
LEFT BREAST ULTRASOUND

[L MLO synth-2D]
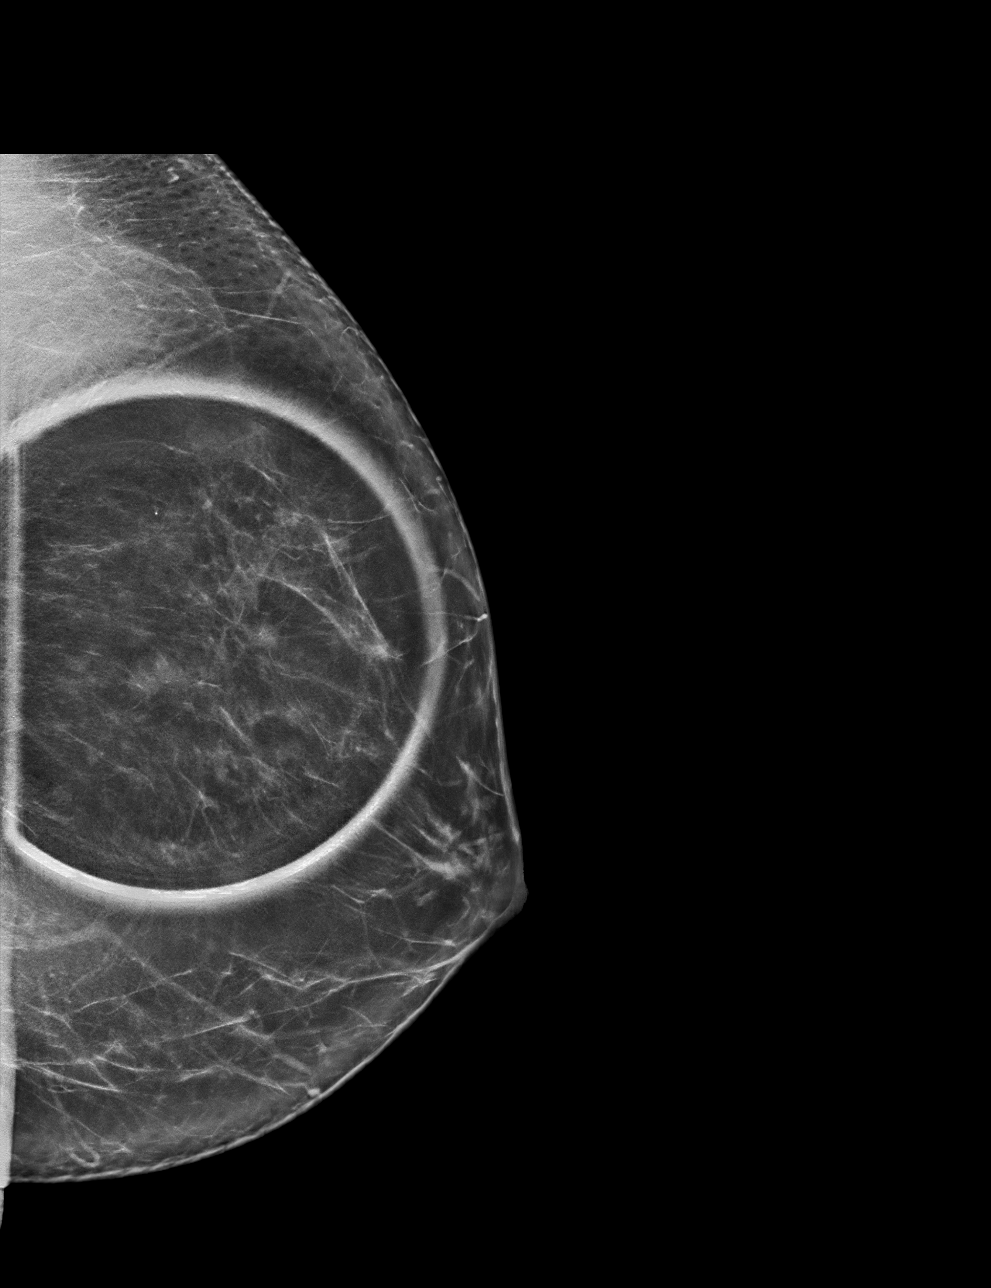

[L CC synth-2D]
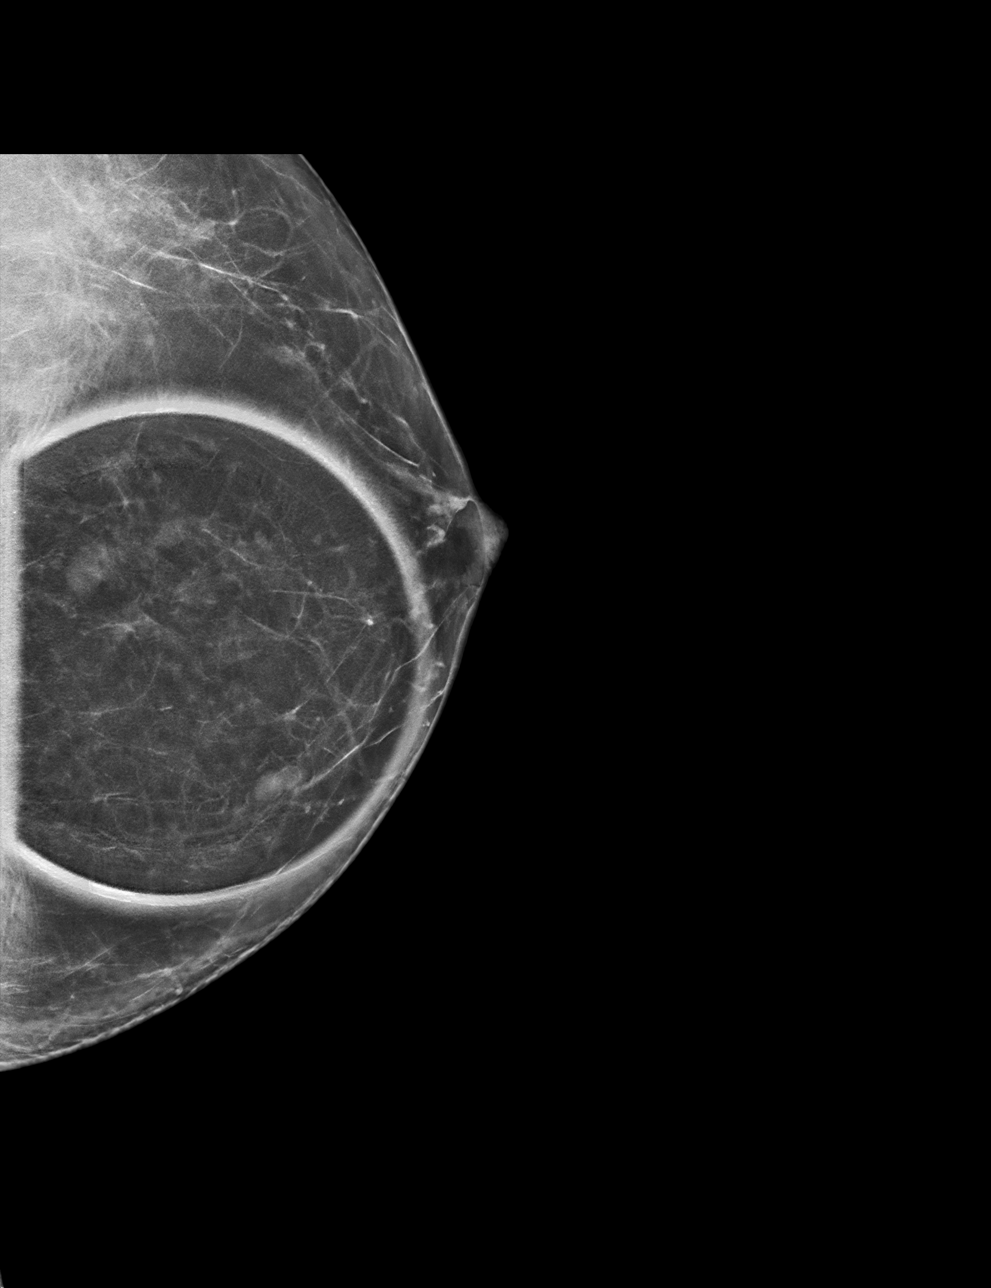

[L CC tomo · tomo slice 34/67.0]
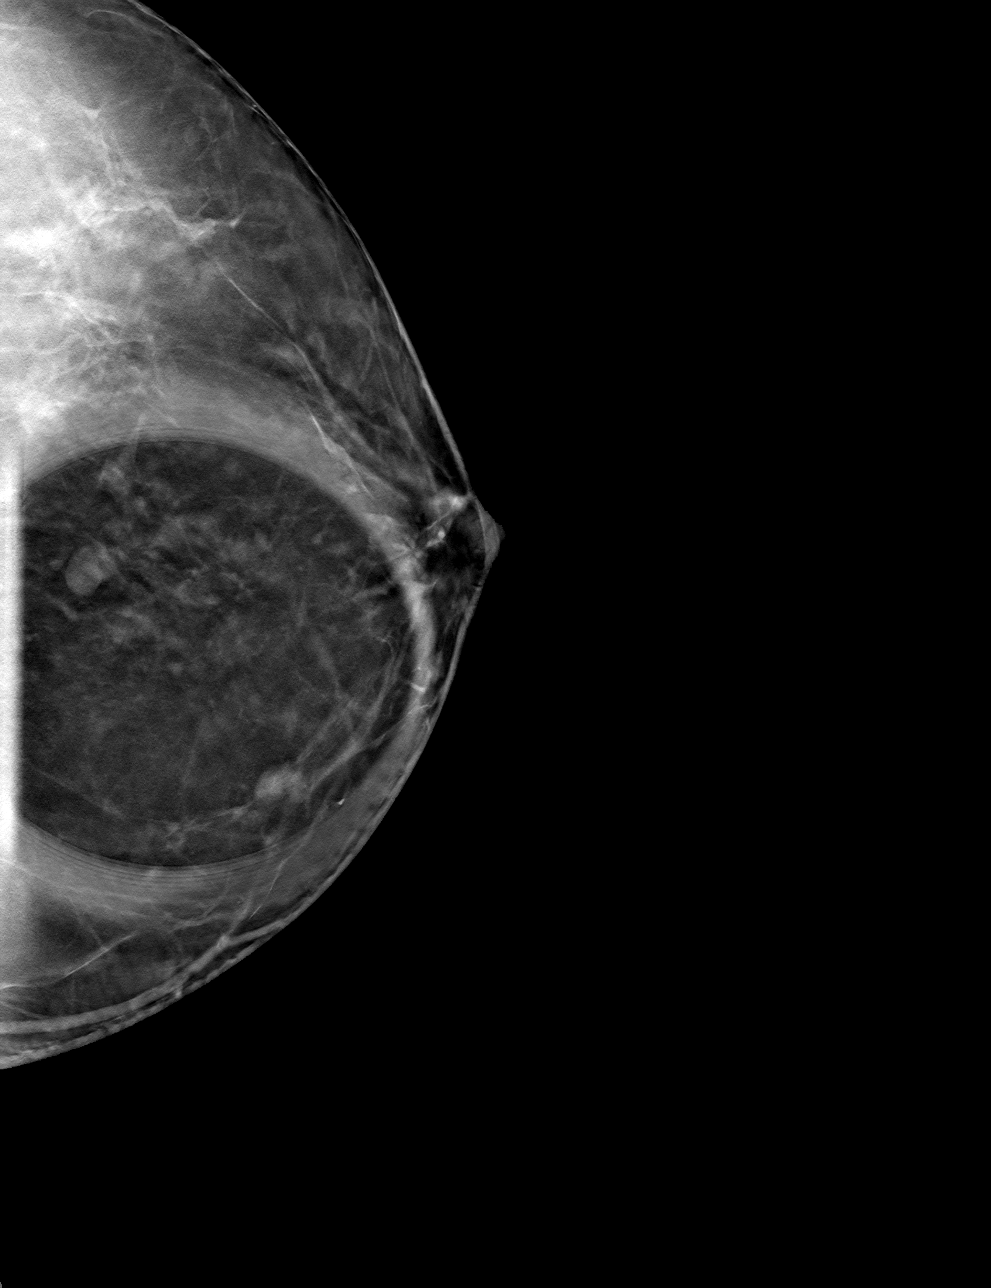

[L MLO tomo · tomo slice 43/84.0]
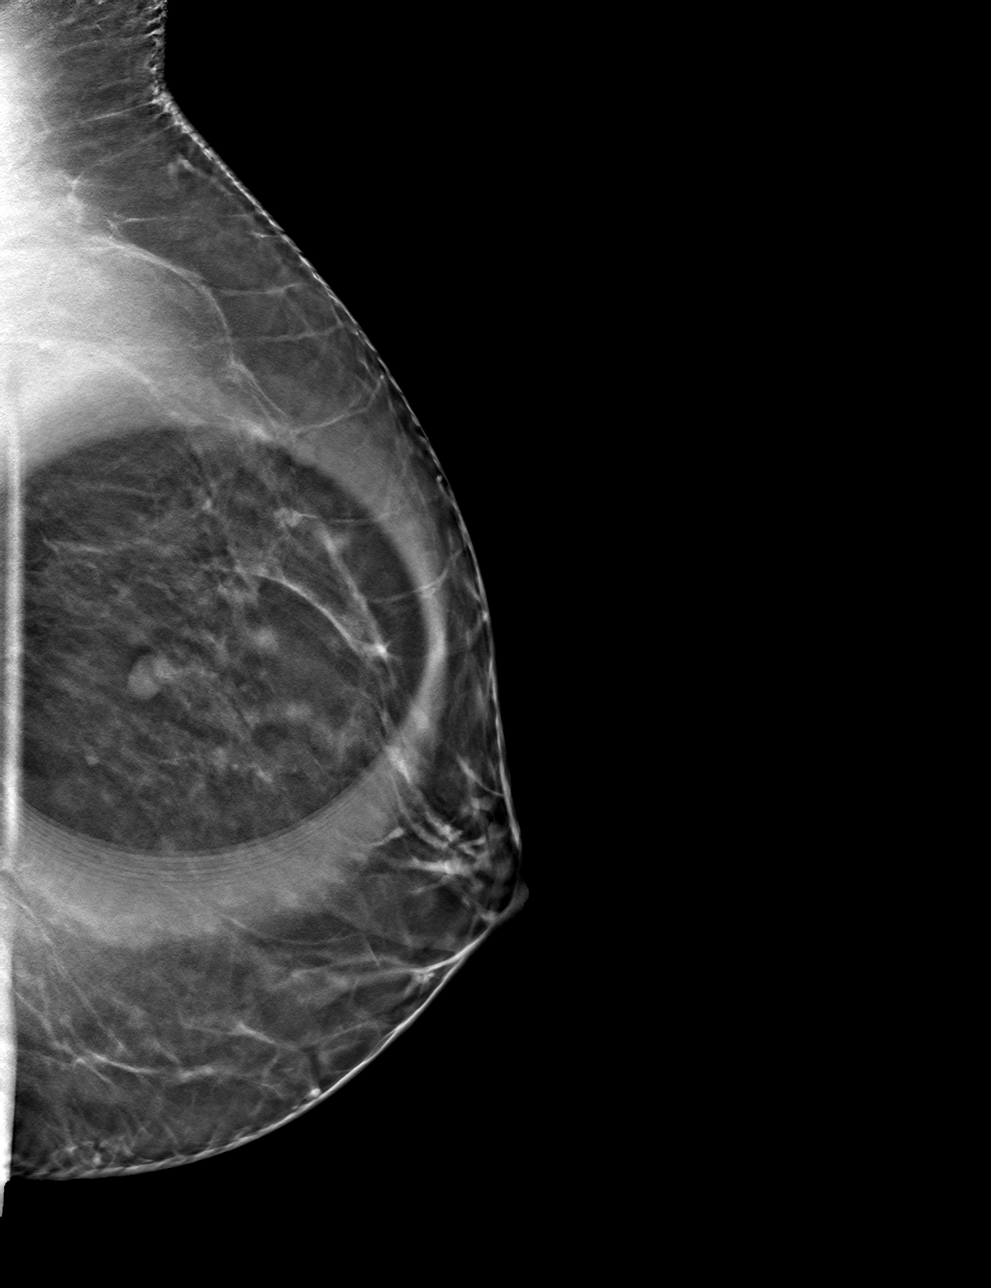

[4 of 12 positions shown; findings below may reference images not displayed]

ACR Breast Density Category b: There are scattered areas of
fibroglandular density.
FINDINGS: Spot compression tomograms were performed of the left breast. There
is an oval circumscribed lobulated mass within the upper slightly
inner left breast measuring 0.8 cm.

Mammographic images were processed with CAD.

Targeted ultrasound of the upper and slightly inner left breast was
performed. There are 2 adjacent cyst at the [DATE] position 2 cm from
the nipple together measuring 0.7 x 0.7 x 0.6 cm. This corresponds
well with the mass seen in the left breast at mammography.
IMPRESSION: Left breast cysts.  No findings of malignancy in the left breast.

RECOMMENDATION:
Screening mammogram in one year.(Code:Q0-Z-X9L)

I have discussed the findings and recommendations with the patient.
If applicable, a reminder letter will be sent to the patient
regarding the next appointment.

BI-RADS CATEGORY  2: Benign.

## 2020-04-30 IMAGING — US US BREAST*L* LIMITED INC AXILLA
1 series · 6 of 6 positions shown · non-contrast
Comparison: Previous exam(s).

CLINICAL DATA: Screening recall for left breast mass.

EXAM:
DIGITAL DIAGNOSTIC UNILATERAL LEFT MAMMOGRAM WITH CAD AND TOMO
LEFT BREAST ULTRASOUND

[Series 1: us breast*left* limited inc axilla · 0.07mm/px · 6 of 6 slices shown]
[im 1/6]
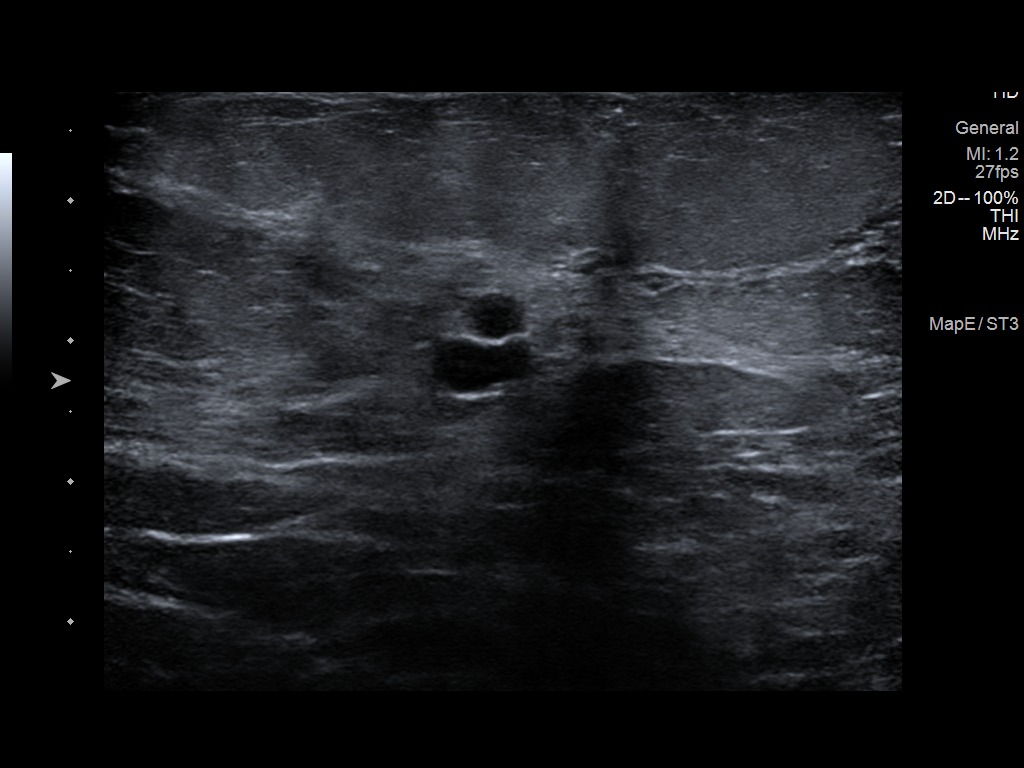
[im 2/6]
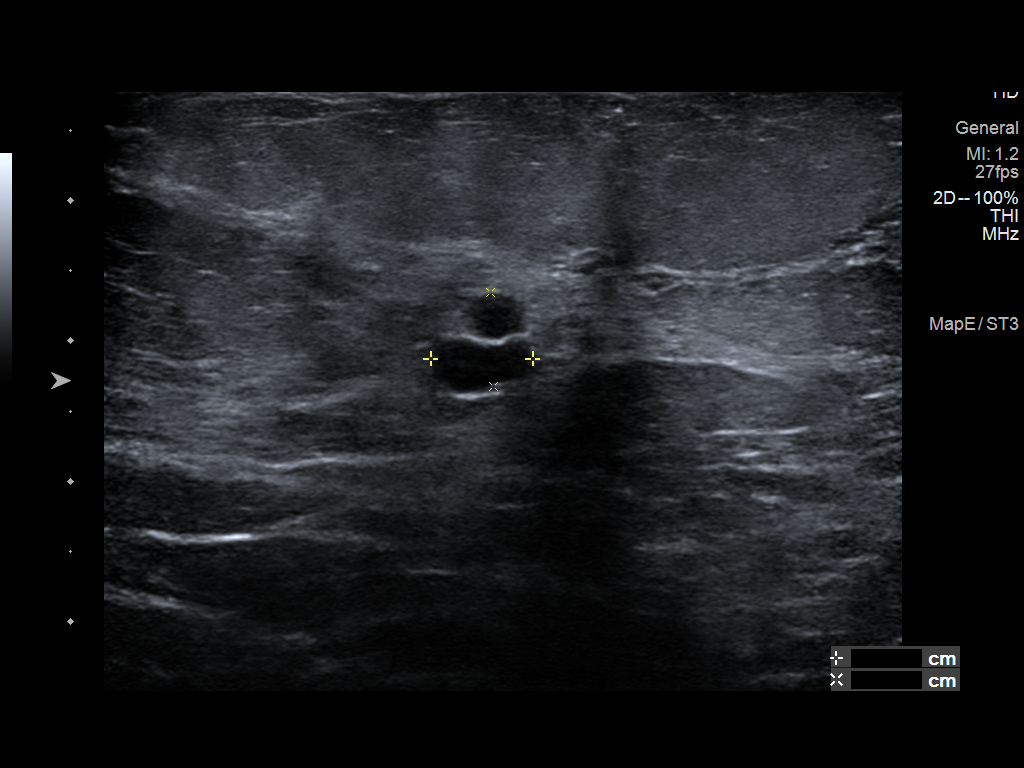
[im 3/6]
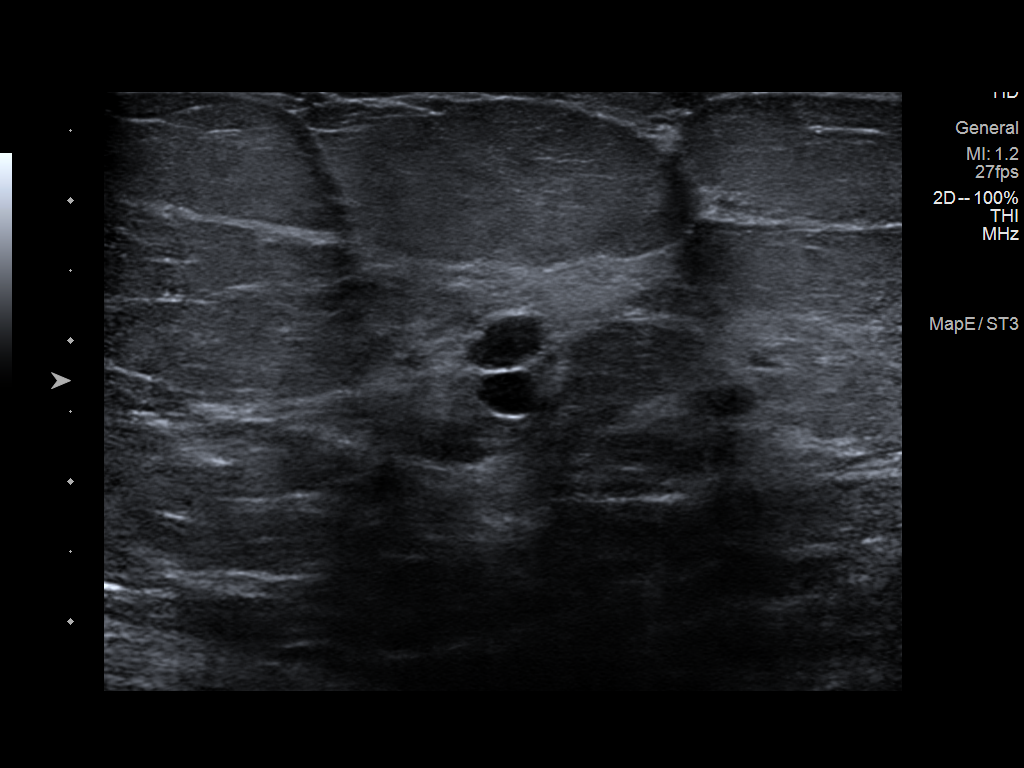
[im 4/6]
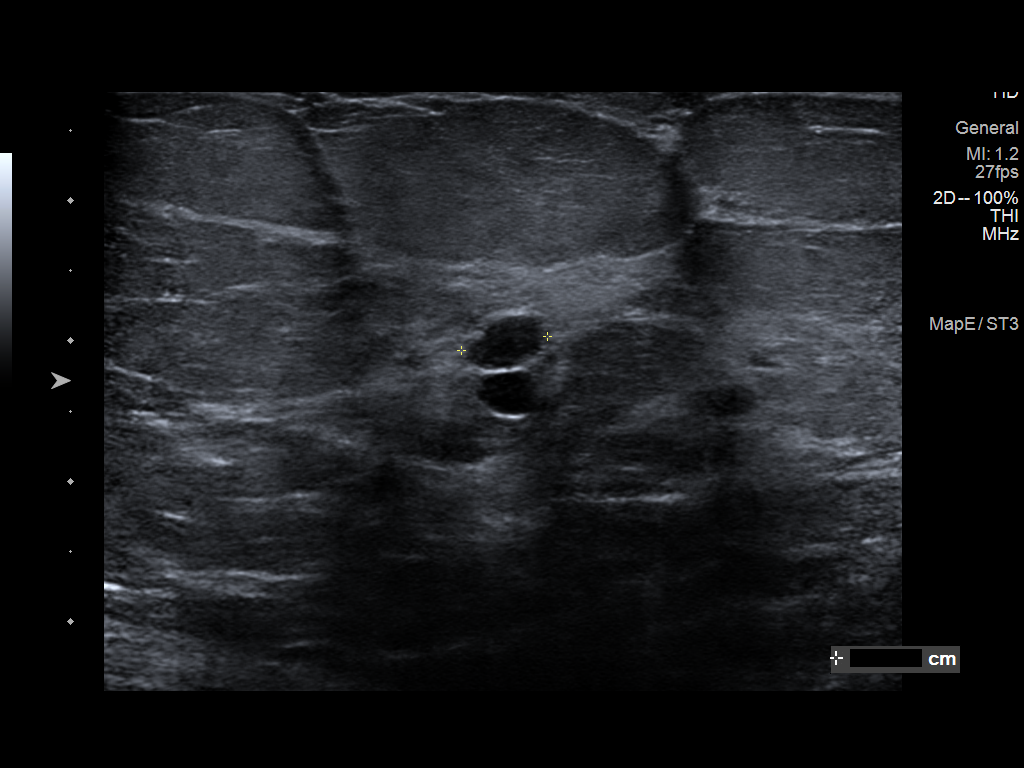
[im 5/6]
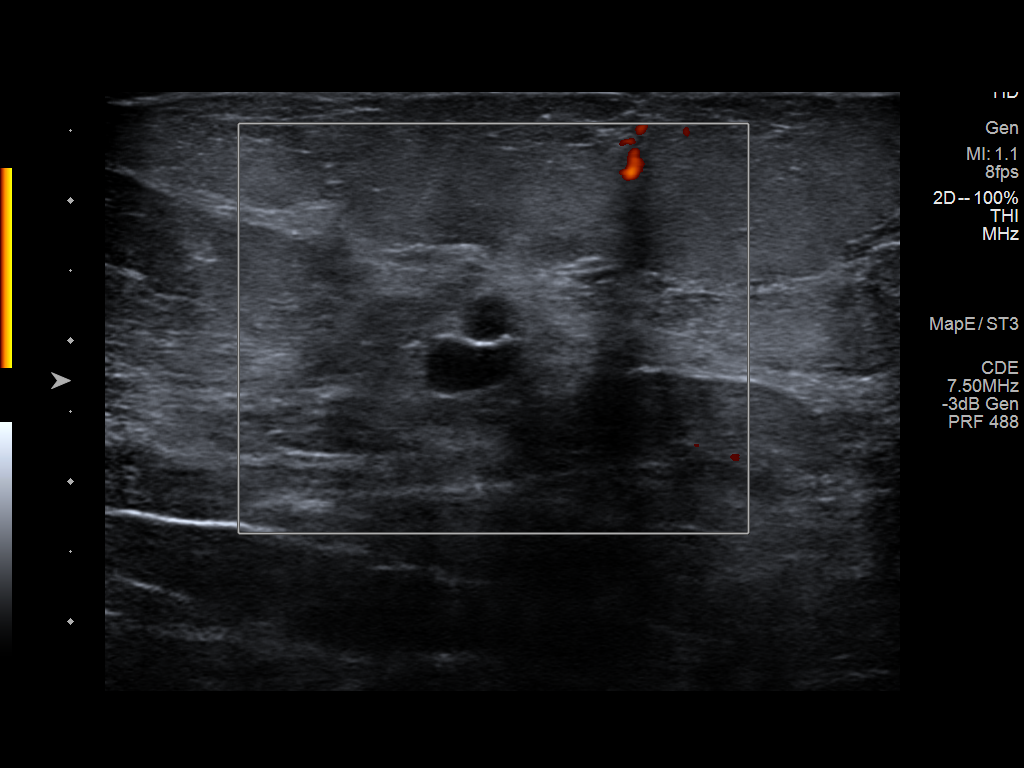
[im 6/6]
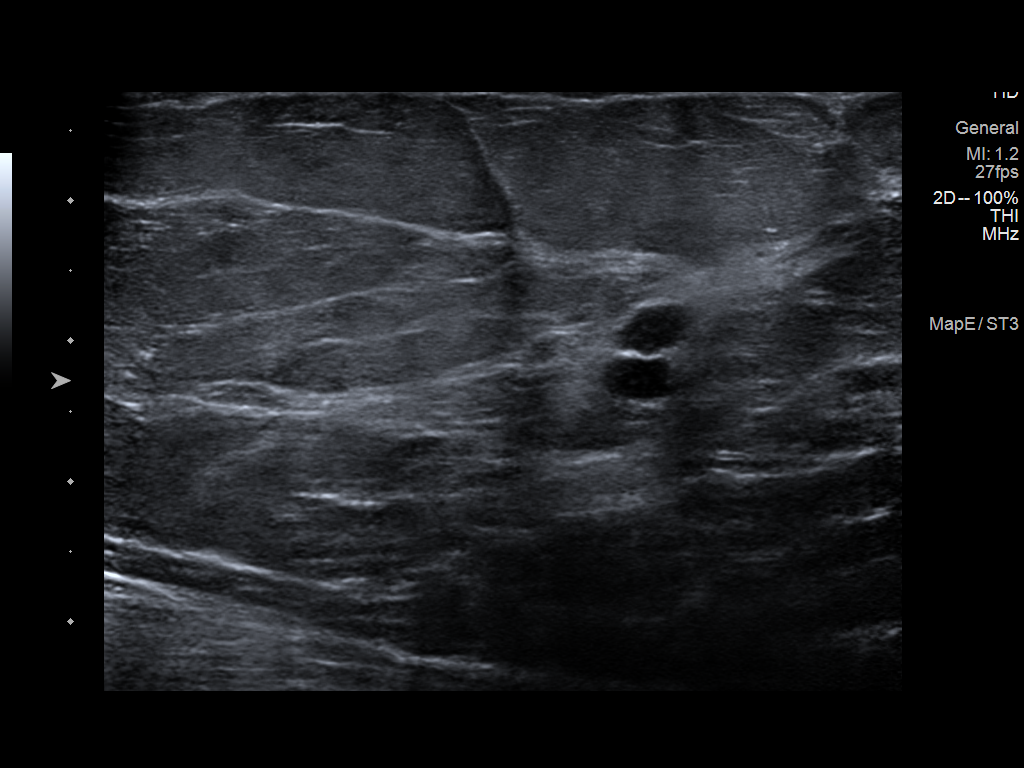

[6 of 6 positions shown; findings below may reference images not displayed]

ACR Breast Density Category b: There are scattered areas of
fibroglandular density.
FINDINGS: Spot compression tomograms were performed of the left breast. There
is an oval circumscribed lobulated mass within the upper slightly
inner left breast measuring 0.8 cm.

Mammographic images were processed with CAD.

Targeted ultrasound of the upper and slightly inner left breast was
performed. There are 2 adjacent cyst at the [DATE] position 2 cm from
the nipple together measuring 0.7 x 0.7 x 0.6 cm. This corresponds
well with the mass seen in the left breast at mammography.
IMPRESSION: Left breast cysts.  No findings of malignancy in the left breast.

RECOMMENDATION:
Screening mammogram in one year.(Code:Q0-Z-X9L)

I have discussed the findings and recommendations with the patient.
If applicable, a reminder letter will be sent to the patient
regarding the next appointment.

BI-RADS CATEGORY  2: Benign.

## 2020-05-14 ENCOUNTER — Ambulatory Visit (INDEPENDENT_AMBULATORY_CARE_PROVIDER_SITE_OTHER): Payer: Medicare HMO | Admitting: Otolaryngology

## 2020-05-14 ENCOUNTER — Encounter (INDEPENDENT_AMBULATORY_CARE_PROVIDER_SITE_OTHER): Payer: Self-pay | Admitting: Otolaryngology

## 2020-05-14 ENCOUNTER — Other Ambulatory Visit: Payer: Self-pay

## 2020-05-14 VITALS — Temp 97.5°F

## 2020-05-14 DIAGNOSIS — R490 Dysphonia: Secondary | ICD-10-CM

## 2020-05-14 NOTE — Progress Notes (Signed)
HPI: Katrina Evans is a 69 y.o. female who presents for evaluation of throat symptoms.  She has had these problems for several years now.  She states that when she lies back she cannot talk well and that her voice cuts off.  This occurs at the dentist office as well when she lies back to get therapy.  She also states that she occasionally has trouble swallowing the results and coughing.  She had a swallow test that was normal.  Did show a small hiatal hernia.  She is having no hoarseness presently in the office and has no trouble talking normally.  She used to smoke but quit 15 years ago.  Denies any sore throat today.  Past Medical History:  Diagnosis Date  . Hypertension    Past Surgical History:  Procedure Laterality Date  . BREAST BIOPSY Right    No Scar  . LAPAROSCOPIC APPENDECTOMY N/A 08/17/2017   Procedure: APPENDECTOMY LAPAROSCOPIC;  Surgeon: Excell Seltzer, MD;  Location: [facility];  Service: General;  Laterality: N/A;  . laser vein surgery     Social History   Socioeconomic History  . Marital status: Single    Spouse name: Not on file  . Number of children: Not on file  . Years of education: Not on file  . Highest education level: Not on file  Occupational History  . Not on file  Tobacco Use  . Smoking status: Former Smoker    Packs/day: 1.00    Years: 35.00    Pack years: 35.00    Quit date: 2005    Years since quitting: 16.5  . Smokeless tobacco: Never Used  Vaping Use  . Vaping Use: Never used  Substance and Sexual Activity  . Alcohol use: Yes    Comment: once yearly  . Drug use: No  . Sexual activity: Not on file  Other Topics Concern  . Not on file  Social History Narrative  . Not on file   Social Determinants of Health   Financial Resource Strain:   . Difficulty of Paying Living Expenses:   Food Insecurity:   . Worried About Charity fundraiser in the Last Year:   . Arboriculturist in the Last Year:   Transportation Needs:   . Lexicographer (Medical):   Marland Kitchen Lack of Transportation (Non-Medical):   Physical Activity:   . Days of Exercise per Week:   . Minutes of Exercise per Session:   Stress:   . Feeling of Stress :   Social Connections:   . Frequency of Communication with Friends and Family:   . Frequency of Social Gatherings with Friends and Family:   . Attends Religious Services:   . Active Member of Clubs or Organizations:   . Attends Archivist Meetings:   Marland Kitchen Marital Status:    Family History  Problem Relation Age of Onset  . Breast cancer Neg Hx    Allergies  Allergen Reactions  . Diclofenac Nausea Only    Pt stated, "Upset my stomach"   Prior to Admission medications   Medication Sig Start Date End Date Taking? Authorizing Provider  acetaminophen (TYLENOL) 500 MG tablet Take 1-2 tablets (500-1,000 mg total) by mouth every 6 (six) hours as needed for mild pain, moderate pain, fever or headache. 08/18/17  Yes Earnstine Regal, PA-C  ergocalciferol (VITAMIN D2) 1.25 MG (50000 UT) capsule Take 50,000 Units by mouth once a week.   Yes [provider]  hydrochlorothiazide (HYDRODIURIL) 25  MG tablet TK 1 T PO QD IN THE MORNING FOR HIGH BP 06/24/19  Yes [provider]  ibuprofen (ADVIL,MOTRIN) 200 MG tablet He can take 2-3 tablets every 6 hours as needed for pain.  You can also alternate this with plain Tylenol for pain.  You can buy this over-the-counter at any drugstore without a prescription. 08/18/17  Yes Earnstine Regal, PA-C     Positive ROS: Otherwise negative  All other systems have been reviewed and were otherwise negative with the exception of those mentioned in the HPI and as above.  Physical Exam: Constitutional: Alert, well-appearing, no acute distress.  She is able to talk normally in the office today when she lied back on the table with no difficulty in speech. Ears: External ears without lesions or tenderness. Ear canals are clear bilaterally with intact,  clear TMs.  Nasal: External nose without lesions. Septum midline with clear mucus discharge.  Middle meatus regions are clear.. Clear nasal passages Oral: Lips and gums without lesions. Tongue and palate mucosa without lesions. Posterior oropharynx clear.  Small tonsils bilaterally. Fiberoptic laryngoscopy was performed to the right nostril.  The nasopharynx was clear.  The base of tongue vallecula and epiglottis were normal.  AE folds were normal.  Vocal cords were clear with normal vocal cord mobility.  Supraglottic region as well as subglottis was clear. Neck: No palpable adenopathy or masses Respiratory: Breathing comfortably  Skin: No facial/neck lesions or rash noted.  Laryngoscopy  Date/Time: 05/14/2020 4:54 PM Performed by: Rozetta Nunnery, MD Authorized by: Rozetta Nunnery, MD   Consent:    Consent obtained:  Verbal   Consent given by:  Patient Procedure details:    Indications: direct visualization of the upper aerodigestive tract and hoarseness, dysphagia, or aspiration     Medication:  Afrin   Instrument: flexible fiberoptic laryngoscope     Scope location: right nare   Sinus:    Right nasopharynx: normal   Mouth:    Oropharynx: normal     Vallecula: normal     Base of tongue: normal     Epiglottis: normal   Throat:    True vocal cords: normal   Comments:     On fiberoptic laryngoscopy patient had normal laryngeal examination and normal vocal cord examination with normal vocal cord mobility.    Assessment: History of voice obstruction questionable etiology with normal laryngeal examination on fiberoptic laryngoscopy.  Plan: Reassured patient of normal laryngeal examination with no vocal cord abnormality noted.  Radene Journey, MD

## 2020-06-26 DIAGNOSIS — K219 Gastro-esophageal reflux disease without esophagitis: Secondary | ICD-10-CM | POA: Diagnosis not present

## 2020-06-26 DIAGNOSIS — Z0000 Encounter for general adult medical examination without abnormal findings: Secondary | ICD-10-CM | POA: Diagnosis not present

## 2020-06-26 DIAGNOSIS — E7800 Pure hypercholesterolemia, unspecified: Secondary | ICD-10-CM | POA: Diagnosis not present

## 2020-06-26 DIAGNOSIS — I10 Essential (primary) hypertension: Secondary | ICD-10-CM | POA: Diagnosis not present

## 2020-06-26 DIAGNOSIS — E559 Vitamin D deficiency, unspecified: Secondary | ICD-10-CM | POA: Diagnosis not present

## 2020-06-26 DIAGNOSIS — I872 Venous insufficiency (chronic) (peripheral): Secondary | ICD-10-CM | POA: Diagnosis not present

## 2020-06-26 DIAGNOSIS — Z1211 Encounter for screening for malignant neoplasm of colon: Secondary | ICD-10-CM | POA: Diagnosis not present

## 2020-06-26 DIAGNOSIS — Z85820 Personal history of malignant melanoma of skin: Secondary | ICD-10-CM | POA: Diagnosis not present

## 2020-06-26 DIAGNOSIS — Z23 Encounter for immunization: Secondary | ICD-10-CM | POA: Diagnosis not present

## 2020-08-12 DIAGNOSIS — R739 Hyperglycemia, unspecified: Secondary | ICD-10-CM | POA: Diagnosis not present

## 2020-08-20 DIAGNOSIS — Z6834 Body mass index (BMI) 34.0-34.9, adult: Secondary | ICD-10-CM | POA: Diagnosis not present

## 2020-08-20 DIAGNOSIS — R7301 Impaired fasting glucose: Secondary | ICD-10-CM | POA: Diagnosis not present

## 2020-08-28 DIAGNOSIS — Z6833 Body mass index (BMI) 33.0-33.9, adult: Secondary | ICD-10-CM | POA: Diagnosis not present

## 2020-08-28 DIAGNOSIS — E559 Vitamin D deficiency, unspecified: Secondary | ICD-10-CM | POA: Diagnosis not present

## 2020-09-04 DIAGNOSIS — E559 Vitamin D deficiency, unspecified: Secondary | ICD-10-CM | POA: Diagnosis not present

## 2020-09-11 DIAGNOSIS — E785 Hyperlipidemia, unspecified: Secondary | ICD-10-CM | POA: Diagnosis not present

## 2020-09-11 DIAGNOSIS — R7301 Impaired fasting glucose: Secondary | ICD-10-CM | POA: Diagnosis not present

## 2020-09-11 DIAGNOSIS — Z6833 Body mass index (BMI) 33.0-33.9, adult: Secondary | ICD-10-CM | POA: Diagnosis not present

## 2020-09-15 DIAGNOSIS — Z20822 Contact with and (suspected) exposure to covid-19: Secondary | ICD-10-CM | POA: Diagnosis not present

## 2020-09-18 DIAGNOSIS — J40 Bronchitis, not specified as acute or chronic: Secondary | ICD-10-CM | POA: Diagnosis not present

## 2020-09-18 DIAGNOSIS — J449 Chronic obstructive pulmonary disease, unspecified: Secondary | ICD-10-CM | POA: Diagnosis not present

## 2020-09-18 DIAGNOSIS — R509 Fever, unspecified: Secondary | ICD-10-CM | POA: Diagnosis not present

## 2020-09-18 DIAGNOSIS — R059 Cough, unspecified: Secondary | ICD-10-CM | POA: Diagnosis not present

## 2020-09-18 DIAGNOSIS — J019 Acute sinusitis, unspecified: Secondary | ICD-10-CM | POA: Diagnosis not present

## 2020-09-18 DIAGNOSIS — Z03818 Encounter for observation for suspected exposure to other biological agents ruled out: Secondary | ICD-10-CM | POA: Diagnosis not present

## 2020-11-23 DIAGNOSIS — Z01812 Encounter for preprocedural laboratory examination: Secondary | ICD-10-CM | POA: Diagnosis not present

## 2020-11-26 DIAGNOSIS — Z86010 Personal history of colonic polyps: Secondary | ICD-10-CM | POA: Diagnosis not present

## 2020-11-26 DIAGNOSIS — K5730 Diverticulosis of large intestine without perforation or abscess without bleeding: Secondary | ICD-10-CM | POA: Diagnosis not present

## 2020-11-26 DIAGNOSIS — K649 Unspecified hemorrhoids: Secondary | ICD-10-CM | POA: Diagnosis not present

## 2020-11-26 LAB — HM COLONOSCOPY

## 2020-12-11 ENCOUNTER — Encounter: Payer: Medicare HMO | Attending: Physician Assistant | Admitting: Registered"

## 2020-12-11 DIAGNOSIS — R7303 Prediabetes: Secondary | ICD-10-CM | POA: Diagnosis not present

## 2020-12-17 ENCOUNTER — Encounter: Payer: Self-pay | Admitting: Registered"

## 2020-12-17 NOTE — Progress Notes (Signed)
On 12/11/20 patient completed Core Session 1 of Diabetes Prevention Program course virtually with [facility]. The following learning objectives were met by the patient during this class:   Virtual Visit via Video Note  I connected with Katrina Evans by a video enabled application and verified that I am speaking with the correct person.  Location: Patient: Home.  Provider: Office.     Learning Objectives:   Be able to explain the purpose and benefits of the National Diabetes Prevention Program.   Be able to describe the events that will take place at every session.   Know the weight loss and physical activity goals established by the [facility] Diabetes Prevention Program.   Know their own individual weight loss and physical activity goals.   Be able to explain the important effect of self-monitoring on behavior change.   Goals:  . Record food and beverage intake in "Food and Activity Tracker" over the next week.  . E-mail completed "Food and Activity Tracker" to Lifestyle Coach next week before session 2. . Circle the foods or beverages you think are highest in fat and calories in your food tracker. . Read the labels on the food you buy, and consider using measuring cups and spoons to help you calculate the amount you eat. We will talk about measuring in more detail in the coming weeks.   Follow-Up Plan:  Attend Core Session 2 next week.   E-mail completed "Food and Activity Tracker" to Lifestyle Coach next week before class.

## 2020-12-18 ENCOUNTER — Encounter (HOSPITAL_BASED_OUTPATIENT_CLINIC_OR_DEPARTMENT_OTHER): Payer: Medicare HMO | Admitting: Registered"

## 2020-12-18 DIAGNOSIS — R7303 Prediabetes: Secondary | ICD-10-CM | POA: Diagnosis not present

## 2020-12-21 ENCOUNTER — Encounter: Payer: Self-pay | Admitting: Registered"

## 2020-12-21 ENCOUNTER — Other Ambulatory Visit: Payer: Self-pay

## 2020-12-21 NOTE — Progress Notes (Addendum)
On 12/18/20 patient completed Core Session 2 of Diabetes Prevention Program course virtually with [facility]. The following learning objectives were met by the patient during this class:   Virtual Visit via Video Note  I connected with Katrina Evans on 12/18/20 at  3:30 PM EST by a video enabled application and verified that I am speaking with the correct person using two identifiers.  Location: Patient: Home.  Provider: Office.   Learning Objectives:  Self-monitor their weight during the weeks following Session 2.   Describe the relationship between fat and calories.   Explain the reason for, and basic principles of, self-monitoring fat grams and calories.   Identify their personal fat gram goals.   Use the ?Fat and Calorie Counter to calculate the calories and fat grams of a given selection of foods.   Keep a running total of the fat grams they eat each day.   Calculate fat, calories, and serving sizes from nutrition labels.   Goals:   Weigh yourself at the same time each day, or every few days, and record your weight in your Food and Activity Tracker.  Write down everything you eat and drink in your Food and Activity Tracker.  Measure portions as much as you can, and start reading labels.   Use the ?Fat and Calorie Counter to figure out the amount of fat and calories in what you ate, and write the amount down in your Food and Activity Tracker.  Keep a running fat gram total throughout the day. Come as close to your fat gram goal as you can.   Follow-Up Plan:  Attend Core Session 3 next week.   Email completed  "Food and Activity Tracker" to Lifestyle Coach next week.

## 2020-12-25 ENCOUNTER — Encounter: Payer: Medicare HMO | Attending: Physician Assistant | Admitting: Registered"

## 2020-12-25 DIAGNOSIS — R7303 Prediabetes: Secondary | ICD-10-CM | POA: Diagnosis not present

## 2020-12-28 ENCOUNTER — Other Ambulatory Visit: Payer: Self-pay | Admitting: Physician Assistant

## 2020-12-28 DIAGNOSIS — Z6835 Body mass index (BMI) 35.0-35.9, adult: Secondary | ICD-10-CM | POA: Diagnosis not present

## 2020-12-28 DIAGNOSIS — I10 Essential (primary) hypertension: Secondary | ICD-10-CM | POA: Diagnosis not present

## 2020-12-28 DIAGNOSIS — Z1231 Encounter for screening mammogram for malignant neoplasm of breast: Secondary | ICD-10-CM

## 2020-12-28 DIAGNOSIS — E7800 Pure hypercholesterolemia, unspecified: Secondary | ICD-10-CM | POA: Diagnosis not present

## 2020-12-28 DIAGNOSIS — K219 Gastro-esophageal reflux disease without esophagitis: Secondary | ICD-10-CM | POA: Diagnosis not present

## 2020-12-28 DIAGNOSIS — R7303 Prediabetes: Secondary | ICD-10-CM | POA: Diagnosis not present

## 2020-12-30 DIAGNOSIS — L812 Freckles: Secondary | ICD-10-CM | POA: Diagnosis not present

## 2020-12-30 DIAGNOSIS — L821 Other seborrheic keratosis: Secondary | ICD-10-CM | POA: Diagnosis not present

## 2020-12-30 DIAGNOSIS — D1801 Hemangioma of skin and subcutaneous tissue: Secondary | ICD-10-CM | POA: Diagnosis not present

## 2020-12-30 DIAGNOSIS — Z85820 Personal history of malignant melanoma of skin: Secondary | ICD-10-CM | POA: Diagnosis not present

## 2020-12-30 DIAGNOSIS — D225 Melanocytic nevi of trunk: Secondary | ICD-10-CM | POA: Diagnosis not present

## 2020-12-31 ENCOUNTER — Encounter: Payer: Self-pay | Admitting: Registered"

## 2020-12-31 NOTE — Progress Notes (Signed)
On 12/25/20 patient completed Core Session 3 of Diabetes Prevention Program course virtually with [facility]. The following learning objectives were met by the patient during this class:   Virtual Visit via Video Note  I connected with Katrina Evans on 12/25/20 at  3:30 PM EST by a video enabled telemedicine application and verified that I am speaking with the correct person using two identifiers.  Location: Patient: Home.  Provider: Office.   Learning Objectives:  Weigh and measure foods.  Estimate the fat and calorie content of common foods.  Describe three ways to eat less fat and fewer calories.  Create a plan to eat less fat for the following week.   Goals:   Track weight when weighing outside of class.   Track food and beverages eaten each day in Food and Activity Tracker and include fat grams and calories for each.   Try to stay within fat gram goal.   Complete plan for eating less high fat foods and answer related homework questions.    Follow-Up Plan:  Attend Core Session 4 next week.   Bring completed "Food and Activity Tracker" next week to be reviewed by Lifestyle Coach.

## 2021-01-08 ENCOUNTER — Encounter (HOSPITAL_BASED_OUTPATIENT_CLINIC_OR_DEPARTMENT_OTHER): Payer: Medicare HMO | Admitting: Registered"

## 2021-01-08 ENCOUNTER — Encounter: Payer: Self-pay | Admitting: Registered"

## 2021-01-08 DIAGNOSIS — R7303 Prediabetes: Secondary | ICD-10-CM

## 2021-01-08 NOTE — Progress Notes (Signed)
On 01/08/21 patient completed Core Session 5 of Diabetes Prevention Program course virtually with [facility]. The following learning objectives were met by the patient during this class:   Virtual Visit via Video Note  I connected with [name] on 01/08/21 at  3:30 PM EDT by a video enabled application and verified that I am speaking with the correct person using two identifiers.  Location: Patient: Home.  Provider: Office.   Learning Objectives:  Establish a physical activity goal.  Explain the importance of the physical activity goal.  Describe their current level of physical activity.  Name ways that they are already physically active.  Develop personal plans for physical activity for the next week.   Goals:   Record weight taken outside of class.   Track foods and beverages eaten each day in the "Food and Activity Tracker," including calories and fat grams for each item.   Make an Activity Plan including date, specific type of activity, and length of time you plan to be active that includes at last 60 minutes of activity for the week.   Track activity type, minutes you were active, and distance you reached each day in the "Food and Activity Tracker."   Follow-Up Plan: . Attend Core Session 6 next week.  . E-mail completed "Food and Activity Tracker" to Lifestyle Coach next week before class

## 2021-01-15 ENCOUNTER — Encounter (HOSPITAL_BASED_OUTPATIENT_CLINIC_OR_DEPARTMENT_OTHER): Payer: Medicare HMO | Admitting: Registered"

## 2021-01-15 DIAGNOSIS — R7303 Prediabetes: Secondary | ICD-10-CM | POA: Diagnosis not present

## 2021-01-20 ENCOUNTER — Encounter: Payer: Self-pay | Admitting: Registered"

## 2021-01-20 DIAGNOSIS — H5203 Hypermetropia, bilateral: Secondary | ICD-10-CM | POA: Diagnosis not present

## 2021-01-20 DIAGNOSIS — H2513 Age-related nuclear cataract, bilateral: Secondary | ICD-10-CM | POA: Diagnosis not present

## 2021-01-20 NOTE — Progress Notes (Signed)
On 01/15/21 patient completed Core Session 6 of Diabetes Prevention Program course virtually with [facility]. The following learning objectives were met by the patient during this class:   Virtual Visit via Video Note  I connected with Katrina Evans on 01/15/21 at  3:30 PM EDT by a video enabled application and verified that I am speaking with the correct person using two identifiers.  Location: Patient: Home.  Provider: Office.   Learning Objectives:  Graph their daily physical activity.   Describe two ways of finding the time to be active.   Define "lifestyle activity."   Describe how to prevent injury.   Develop an activity plan for the coming week.   Goals:   Record weight taken outside of class.   Track foods and beverages eaten each day in the "Food and Activity Tracker," including calories and fat grams for each item.    Track activity type, minutes you were active, and distance you reached each day in the "Food and Activity Tracker."   Set aside one 20 to 30-minute block of time every day or find two or more periods of 10 to15 minutes each for physical activity.   Warm up, cool down, and stretch.  Make a Physical Activities Plan for the Week.   Follow-Up Plan:  Attend Core Session 7 next week.   E-mail completed "Food and Activity Tracker" to Lifestyle Coach next week before class

## 2021-01-22 ENCOUNTER — Encounter: Payer: Medicare HMO | Attending: Physician Assistant | Admitting: Registered"

## 2021-01-22 DIAGNOSIS — R7303 Prediabetes: Secondary | ICD-10-CM | POA: Diagnosis not present

## 2021-01-29 ENCOUNTER — Encounter (HOSPITAL_BASED_OUTPATIENT_CLINIC_OR_DEPARTMENT_OTHER): Payer: Medicare HMO | Admitting: Registered"

## 2021-01-29 ENCOUNTER — Encounter: Payer: Self-pay | Admitting: Registered"

## 2021-01-29 DIAGNOSIS — R7303 Prediabetes: Secondary | ICD-10-CM | POA: Diagnosis not present

## 2021-01-29 DIAGNOSIS — Z713 Dietary counseling and surveillance: Secondary | ICD-10-CM

## 2021-01-29 NOTE — Progress Notes (Signed)
On 01/22/21 patient completed Core Session 7 of Diabetes Prevention Program course virtually with [facility]. The following learning objectives were met by the patient during this class:   Virtual Visit via Video Note  I connected with [name] on 01/22/21 at  3:30 PM EDT by a video enabled application and verified that I am speaking with the correct person using two identifiers.  Location: Patient: Home.  Provider: Office.   Learning Objectives:  Define calorie balance.  Explain how healthy eating and being active are related in terms of calorie balance.   Describe the relationship between calorie balance and weight loss.   Describe his or her progress as it relates to calorie balance.   Develop an activity plan for the coming week.   Goals:   Record weight taken outside of class.   Track foods and beverages eaten each day in the "Food and Activity Tracker," including calories and fat grams for each item.    Track activity type, minutes you were active, and distance you reached each day in the "Food and Activity Tracker."   Set aside one 20 to 30-minute block of time every day or find two or more periods of 10 to15 minutes each for physical activity.   Make a Physical Activities Plan for the Week.   Make active lifestyle choices all through the day   Stay at or go slightly over activity goal.   Follow-Up Plan:  Attend Core Session 8 next week.   E-mail completed "Food and Activity Tracker" to Lifestyle Coach next week before class

## 2021-02-01 ENCOUNTER — Encounter: Payer: Self-pay | Admitting: Registered"

## 2021-02-01 NOTE — Progress Notes (Signed)
On 01/29/21 patient completed Core Session 8 of Diabetes Prevention Program course virtually with [facility]. The following learning objectives were met by the patient during this class:   Virtual Visit via Video Note  I connected with Katrina Evans by a video enabled application and verified that I am speaking with the correct person using two identifiers.  Location: Patient: Home.  Provider: Office.   Learning Objectives:  Recognize positive and negative food and activity cues.   Change negative food and activity cues to positive cues.   Add positive cues for activity and eliminate cues for inactivity.   Develop a plan for removing one problem food cue for the coming week.   Goals:   Record weight taken outside of class.   Track foods and beverages eaten each day in the "Food and Activity Tracker," including calories and fat grams for each item.    Track activity type, minutes you were active, and distance you reached each day in the "Food and Activity Tracker."   Set aside one 20 to 30-minute block of time every day or find two or more periods of 10 to15 minutes each for physical activity.   Remove one problem food cue.   Add one positive cue for being more active.  Follow-Up Plan: . Attend Core Session 9 next week.  . Email completed "Food and Activity Tracker" next week to be reviewed by Lifestyle Coach.

## 2021-02-12 ENCOUNTER — Encounter (HOSPITAL_BASED_OUTPATIENT_CLINIC_OR_DEPARTMENT_OTHER): Payer: Medicare HMO | Admitting: Registered"

## 2021-02-12 ENCOUNTER — Encounter: Payer: Self-pay | Admitting: Registered"

## 2021-02-12 DIAGNOSIS — R7303 Prediabetes: Secondary | ICD-10-CM | POA: Diagnosis not present

## 2021-02-12 NOTE — Progress Notes (Signed)
On 02/12/21 patient completed Core Session 9 of Diabetes Prevention Program course virtually with [facility]. The following learning objectives were met by the patient during this class:   Virtual Visit via Video Note  I connected with [name] by a video enabled application and verified that I am speaking with the correct person using two identifiers.  Location: Patient: Home.  Provider: Office.   Learning Objectives:  List and describe five steps to problem solving.   Apply the five problem solving steps to resolve a problem he or she has with eating less fat and fewer calories or being more active.   Goals:   Record weight taken outside of class.   Track foods and beverages eaten each day in the "Food and Activity Tracker," including calories and fat grams for each item.    Track activity type, minutes you were active, and distance you reached each day in the "Food and Activity Tracker."   Set aside one 20 to 30-minute block of time every day or find two or more periods of 10 to15 minutes each for physical activity.   Use problem solving action plan created during session to problem solve.   Follow-Up Plan:  Attend Core Session 10 next week.   Email completed "Food and Activity Tracker" next week to be reviewed by Lifestyle Coach.  Email menus from favorite restaurants to next session for future discussion.

## 2021-02-18 ENCOUNTER — Ambulatory Visit
Admission: RE | Admit: 2021-02-18 | Discharge: 2021-02-18 | Disposition: A | Discharge status: Home / Self Care | Payer: Medicare HMO | Source: Ambulatory Visit | Attending: Physician Assistant | Admitting: Physician Assistant

## 2021-02-18 ENCOUNTER — Other Ambulatory Visit: Payer: Self-pay

## 2021-02-18 DIAGNOSIS — Z1231 Encounter for screening mammogram for malignant neoplasm of breast: Secondary | ICD-10-CM | POA: Diagnosis not present

## 2021-02-19 ENCOUNTER — Encounter: Payer: Medicare HMO | Admitting: Registered"

## 2021-02-19 DIAGNOSIS — R7303 Prediabetes: Secondary | ICD-10-CM

## 2021-02-26 DIAGNOSIS — M1711 Unilateral primary osteoarthritis, right knee: Secondary | ICD-10-CM | POA: Diagnosis not present

## 2021-02-26 DIAGNOSIS — M545 Low back pain, unspecified: Secondary | ICD-10-CM | POA: Diagnosis not present

## 2021-03-05 ENCOUNTER — Encounter: Payer: Medicare HMO | Attending: Physician Assistant | Admitting: Dietician

## 2021-03-05 ENCOUNTER — Other Ambulatory Visit: Payer: Self-pay

## 2021-03-05 DIAGNOSIS — R7303 Prediabetes: Secondary | ICD-10-CM | POA: Diagnosis not present

## 2021-03-05 NOTE — Progress Notes (Signed)
On 03/05/2021 patient completed Session 11 of Diabetes Prevention Program course virtually with [facility]. By the end of this session patients are able to complete the following objectives:   Virtual Visit via Video Note  I connected with Katrina Evans, 26-Sep-1951 by a video enabled application and verified that I am speaking with the correct person using two identifiers.  Location: Patient: Virtual Provider: Office  Learning Objectives:  Give examples of negative thoughts that could prevent them from meeting their goals of losing weight and being more physically active.   Describe how to stop negative thoughts and talk back to them with positive thoughts.   Practice 1) stopping negative thoughts and 2) talking back to negative thoughts with positive ones.    Goals:   Record weight taken outside of class.   Track foods and beverages eaten each day in the "Food and Activity Tracker," including calories and fat grams for each item.    Track activity type, minutes you were active, and distance you reached each day in the "Food and Activity Tracker."   If you have any negative thoughts-write them in your Food and Activity Trackers, along with how you talked back to them. Practice stopping negative thoughts and talking back to them with positive thoughts.   Follow-Up Plan:  Attend Core Session 12 next week.   Email completed "Food and Activity Tracker" before next week to be reviewed by Lifestyle Coach.

## 2021-03-12 ENCOUNTER — Encounter (HOSPITAL_BASED_OUTPATIENT_CLINIC_OR_DEPARTMENT_OTHER): Payer: Medicare HMO | Admitting: Registered"

## 2021-03-12 DIAGNOSIS — Z713 Dietary counseling and surveillance: Secondary | ICD-10-CM

## 2021-03-12 DIAGNOSIS — R7303 Prediabetes: Secondary | ICD-10-CM | POA: Diagnosis not present

## 2021-03-17 ENCOUNTER — Encounter: Payer: Self-pay | Admitting: Registered"

## 2021-03-17 NOTE — Progress Notes (Signed)
Patient was seen on 03/12/21 for the Core Session 12 of Diabetes Prevention Program course at [facility]. By the end of this session patients are able to complete the following objectives:   Virtual Visit via Video Note  I connected with Katrina Evans on 03/12/21 at  3:30 PM EDT by a video enabled application and verified that I am speaking with the correct person using two identifiers.  Location: Patient: Home.  Provider: Office.   Learning Objectives:  Describe their current progress toward defined goals.  Describe common causes for slipping from healthy eating or being  active.  Explain what to do to get back on their feet after a slip.  Goals:   Record weight taken outside of class.   Track foods and beverages eaten each day in the "Food and Activity Tracker," including calories and fat grams for each item.    Track activity type, minutes active, and distance reached each day in the "Food and Activity Tracker."   Try out the two action plans created during session- "Slips from Healthy Eating: Action Plan" and "Slips from Being Active: Action Plan"  Answer questions on the handout.   Follow-Up Plan:  Attend Core Session 13 next week.   Bring completed "Food and Activity Tracker" next week to be reviewed by Lifestyle Coach.

## 2021-03-19 ENCOUNTER — Encounter: Payer: Self-pay | Admitting: Registered"

## 2021-03-19 ENCOUNTER — Ambulatory Visit (HOSPITAL_BASED_OUTPATIENT_CLINIC_OR_DEPARTMENT_OTHER): Payer: Medicare HMO | Admitting: Registered"

## 2021-03-19 DIAGNOSIS — Z713 Dietary counseling and surveillance: Secondary | ICD-10-CM

## 2021-03-19 DIAGNOSIS — R7303 Prediabetes: Secondary | ICD-10-CM | POA: Diagnosis not present

## 2021-03-19 NOTE — Progress Notes (Signed)
On 03/19/21 patient completed the Core Session 13 of Diabetes Prevention Program course virtually with [facility]. By the end of this session patients are able to complete the following objectives:   Virtual Visit via Video Note  I connected with [name] on 03/19/21 at  3:30 PM EDT by a video enabled application and verified that I am speaking with the correct person using two identifiers.  Location: Patient: Home.  Provider: Office.   Learning Objectives:  Describe ways to add interest and variety to their activity plans.  Define ?aerobic fitness.  Explain the four F.I.T.T. principles (frequency, intensity, time, and type of activity) and how they relate to aerobic fitness.   Goals:   Record weight taken outside of class.   Track foods and beverages eaten each day in the "Food and Activity Tracker," including calories and fat grams for each item.    Track activity type, minutes you were active, and distance you reached each day in the "Food and Activity Tracker."   Do your best to reach activity goal for the week.  Use one of the F.I.T.T. principles to jump start workouts.  Document activity level on the "To Do Next Week" handout.  Follow-Up Plan:  Attend Core Session 14 next week.   Email completed "Food and Activity Tracker" before next week to be reviewed by Lifestyle Coach.

## 2021-03-26 ENCOUNTER — Encounter: Payer: Medicare HMO | Admitting: Registered"

## 2021-04-02 ENCOUNTER — Encounter: Payer: Medicare HMO | Attending: Physician Assistant | Admitting: Registered"

## 2021-04-02 DIAGNOSIS — R7303 Prediabetes: Secondary | ICD-10-CM | POA: Insufficient documentation

## 2021-04-09 ENCOUNTER — Encounter (HOSPITAL_BASED_OUTPATIENT_CLINIC_OR_DEPARTMENT_OTHER): Payer: Medicare HMO | Admitting: Registered"

## 2021-04-09 ENCOUNTER — Encounter: Payer: Self-pay | Admitting: Registered"

## 2021-04-09 DIAGNOSIS — R7303 Prediabetes: Secondary | ICD-10-CM

## 2021-04-09 NOTE — Progress Notes (Signed)
On 04/02/21 patient completed Core Session 15 of Diabetes Prevention Program course virtually with [facility]. By the end of this session patients are able to complete the following objectives:   Virtual Visit via Video Note  I connected with Katrina Evans on 04/02/21 at  3:30 PM EDT by a video enabled application and verified that I am speaking with the correct person using two identifiers.  Location: Patient: Home.  Provider: Office.   Learning Objectives: Explain how to prevent stress or cope with unavoidable stress.  Describe how this program can be a source of stress.  Explain how to manage stressful situations.  Create and follow an action plan for either preventing or coping with a stressful situation.   Goals:  Record weight taken outside of class.  Track foods and beverages eaten each day in the "Food and Activity Tracker," including calories and fat grams for each item.   Track activity type, minutes you were active, and distance you reached each day in the "Food and Activity Tracker."  Do your best to reach activity goal for the week. Follow your action plan to reduce stress.  Answer questions on handout regarding success of action plan.   Follow-Up Plan: Attend Core Session 16 next week.  Email completed "Food and Activity Tracker" before next week to be reviewed by Lifestyle Coach.

## 2021-04-15 ENCOUNTER — Encounter: Payer: Self-pay | Admitting: Registered"

## 2021-04-15 NOTE — Progress Notes (Signed)
On 04/09/21 patient completed Core Session 16 of Diabetes Prevention Program course virtually with [facility]. By the end of this session patients are able to complete the following objectives:   Virtual Visit via Video Note  I connected with [name] on 04/09/21 at  3:30 PM EDT by a video enabled application and verified that I am speaking with the correct person using two identifiers.  Location: Patient: Home.  Provider: Office.   Learning Objectives: Measure their progress toward weight and physical activity goals since Session 1.  Develop a plan for improving progress, if their goals have not yet been attained.  Describe ways to stay motivated long-term.   Goals:  Record weight taken outside of class.  Track foods and beverages eaten each day in the "Food and Activity Tracker," including calories and fat grams for each item.   Track activity type, minutes you were active, and distance you reached each day in the "Food and Activity Tracker."  Utilize action plan to help stay motivated and complete questions on "To Do List."   Follow-Up Plan: Attend session 17 in two weeks.  Email completed "Food and Activity Tracker" before next session to be reviewed by Lifestyle Coach.

## 2021-04-16 ENCOUNTER — Encounter: Payer: Self-pay | Admitting: Registered"

## 2021-04-16 ENCOUNTER — Encounter: Payer: Medicare HMO | Admitting: Registered"

## 2021-04-16 DIAGNOSIS — R7303 Prediabetes: Secondary | ICD-10-CM

## 2021-04-16 NOTE — Progress Notes (Signed)
On 04/16/21 patient attended a virtual grocery store tour session as part of the Diabetes Prevention Program with [facility].  Virtual Visit via Video Note  I connected with Katrina Evans by a video enabled application and verified that I am speaking with the correct person using two identifiers.  Location: Patient: Home.  Provider: Office.    Learning Objectives: Develop a plan for our grocery shopping experience Putting together a list How to navigate the grocery store Identify 4 main sections of the grocery store Produce Meat/Poultry/Fish [place] Consider tips for shopping in each of these four sections Reflect on our own shopping habits Create a new goal for our next grocery shopping experience Engage in a group discussion  Goals:  Record weight taken outside of class.  Track foods and beverages eaten each day in the "Food and Activity Tracker," including calories and fat grams for each item.  Create one new goal for the next grocery shopping experience based on the information provided today  Follow-Up Plan: Attend next session.  Email completed "Food and Activity Tracker" before next session to be reviewed by Lifestyle Coach.

## 2021-04-19 DIAGNOSIS — M7581 Other shoulder lesions, right shoulder: Secondary | ICD-10-CM | POA: Diagnosis not present

## 2021-04-19 DIAGNOSIS — M542 Cervicalgia: Secondary | ICD-10-CM | POA: Diagnosis not present

## 2021-04-29 ENCOUNTER — Ambulatory Visit (INDEPENDENT_AMBULATORY_CARE_PROVIDER_SITE_OTHER): Payer: Medicare HMO | Admitting: Bariatrics

## 2021-04-29 ENCOUNTER — Encounter (INDEPENDENT_AMBULATORY_CARE_PROVIDER_SITE_OTHER): Payer: Self-pay | Admitting: Bariatrics

## 2021-04-29 ENCOUNTER — Other Ambulatory Visit: Payer: Self-pay

## 2021-04-29 VITALS — BP 117/83 | HR 68 | Temp 98.5°F | Ht 62.0 in | Wt 189.0 lb

## 2021-04-29 DIAGNOSIS — R5383 Other fatigue: Secondary | ICD-10-CM | POA: Diagnosis not present

## 2021-04-29 DIAGNOSIS — Z6834 Body mass index (BMI) 34.0-34.9, adult: Secondary | ICD-10-CM

## 2021-04-29 DIAGNOSIS — R0602 Shortness of breath: Secondary | ICD-10-CM | POA: Diagnosis not present

## 2021-04-29 DIAGNOSIS — E559 Vitamin D deficiency, unspecified: Secondary | ICD-10-CM | POA: Diagnosis not present

## 2021-04-29 DIAGNOSIS — E7849 Other hyperlipidemia: Secondary | ICD-10-CM | POA: Diagnosis not present

## 2021-04-29 DIAGNOSIS — Z1331 Encounter for screening for depression: Secondary | ICD-10-CM | POA: Diagnosis not present

## 2021-04-29 DIAGNOSIS — I10 Essential (primary) hypertension: Secondary | ICD-10-CM | POA: Diagnosis not present

## 2021-04-29 DIAGNOSIS — E6609 Other obesity due to excess calories: Secondary | ICD-10-CM | POA: Diagnosis not present

## 2021-04-29 DIAGNOSIS — Z0289 Encounter for other administrative examinations: Secondary | ICD-10-CM

## 2021-04-29 DIAGNOSIS — R7303 Prediabetes: Secondary | ICD-10-CM | POA: Diagnosis not present

## 2021-04-29 DIAGNOSIS — E785 Hyperlipidemia, unspecified: Secondary | ICD-10-CM | POA: Diagnosis not present

## 2021-04-30 ENCOUNTER — Encounter: Payer: Medicare HMO | Attending: Physician Assistant | Admitting: Registered"

## 2021-04-30 DIAGNOSIS — R7303 Prediabetes: Secondary | ICD-10-CM

## 2021-04-30 LAB — LIPID PANEL WITH LDL/HDL RATIO
Cholesterol, Total: 182 mg/dL (ref 100–199)
HDL: 68 mg/dL (ref >39)
LDL Chol Calc (NIH): 104 mg/dL — ABNORMAL HIGH (ref 0–99)
LDL/HDL Ratio: 1.5 ratio (ref 0.0–3.2)
Triglycerides: 52 mg/dL (ref 0–149)
VLDL Cholesterol Cal: 10 mg/dL (ref 5–40)

## 2021-04-30 LAB — VITAMIN D 25 HYDROXY (VIT D DEFICIENCY, FRACTURES): Vit D, 25-Hydroxy: 50.8 ng/mL (ref 30.0–100.0)

## 2021-04-30 LAB — T4, FREE: Free T4: 1.23 ng/dL (ref 0.82–1.77)

## 2021-04-30 LAB — T3: T3, Total: 89 ng/dL (ref 71–180)

## 2021-04-30 LAB — HEMOGLOBIN A1C
Est. average glucose Bld gHb Est-mCnc: 126 mg/dL
Hgb A1c MFr Bld: 6 % — ABNORMAL HIGH (ref 4.8–5.6)

## 2021-04-30 LAB — INSULIN, RANDOM: INSULIN: 24.2 u[IU]/mL (ref 2.6–24.9)

## 2021-04-30 LAB — TSH: TSH: 2.58 u[IU]/mL (ref 0.450–4.500)

## 2021-05-05 ENCOUNTER — Encounter (INDEPENDENT_AMBULATORY_CARE_PROVIDER_SITE_OTHER): Payer: Self-pay | Admitting: Bariatrics

## 2021-05-05 DIAGNOSIS — N950 Postmenopausal bleeding: Secondary | ICD-10-CM | POA: Diagnosis not present

## 2021-05-05 DIAGNOSIS — E669 Obesity, unspecified: Secondary | ICD-10-CM | POA: Diagnosis not present

## 2021-05-05 NOTE — Progress Notes (Signed)
Chief Complaint:   OBESITY Katrina Evans (MR# [id number]) is a 70 y.o. female who presents for evaluation and treatment of obesity and related comorbidities. Current BMI is Body mass index is 34.57 kg/m. Katrina Evans has been struggling with her weight for many years and has been unsuccessful in either losing weight, maintaining weight loss, or reaching her healthy weight goal.  Katrina Evans is currently in the action stage of change and ready to dedicate time achieving and maintaining a healthier weight. Katrina Evans is interested in becoming our patient and working on intensive lifestyle modifications including (but not limited to) diet and exercise for weight loss.  Katrina Evans likes to [employer] but notes time as an obstacle. She craves some carbohydrates.  Katrina Evans's habits were reviewed today and are as follows: Her family eats meals together, she thinks her family will eat healthier with her, her desired weight loss is 29 lbs, she started gaining weight after moving here and stopping races, her heaviest weight ever was 190 pounds, she has significant food cravings issues, she snacks frequently in the evenings, she is frequently drinking liquids with calories, she frequently eats larger portions than normal, and she has binge eating behaviors.  Depression Screen Katrina Evans's Food and Mood (modified PHQ-9) score was 16.  Depression screen PHQ 2/9 04/29/2021  Decreased Interest 3  Down, Depressed, Hopeless 2  PHQ - 2 Score 5  Altered sleeping 1  Tired, decreased energy 2  Change in appetite 2  Feeling bad or failure about yourself  2  Trouble concentrating 2  Moving slowly or fidgety/restless 1  Suicidal thoughts 1  PHQ-9 Score 16  Difficult doing work/chores Somewhat difficult   Subjective:   1. Other fatigue Katrina Evans Katrina Evans daytime somnolence and admits to waking up still tired. Patient has a history of symptoms of hypertension. Katrina Evans generally gets 7 hours of sleep per night,  and states that she has poor sleep quality. Snoring is present. Apneic episodes are not present. Epworth Sleepiness Score is 1.  2. SOB (shortness of breath) on exertion Katrina Evans notes increasing shortness of breath with exercising and seems to be worsening over time with weight gain. She notes getting out of breath sooner with activity than she used to. This has gotten worse recently. Katrina Evans Katrina Evans shortness of breath at rest or orthopnea.  3. Essential hypertension Katrina Evans is taking HCTZ.  BP Readings from Last 3 Encounters:  04/29/21 117/83  08/29/19 135/74  08/18/17 (!) 149/84   4. Other hyperlipidemia Pt takes atorvastatin.  Lab Results  Component Value Date   CHOL 182 04/29/2021   HDL 68 04/29/2021   LDLCALC 104 (H) 04/29/2021   TRIG 52 04/29/2021   5. Pre-diabetes Katrina Evans is not on medication.  Lab Results  Component Value Date   HGBA1C 6.0 (H) 04/29/2021   Lab Results  Component Value Date   INSULIN 24.2 04/29/2021   6. Vitamin D deficiency She is currently taking OTC vitamin D each day. She Katrina Evans nausea, vomiting or muscle weakness.  Lab Results  Component Value Date   VD25OH 50.8 04/29/2021    Assessment/Plan:   1. Other fatigue Katrina Evans does feel that her weight is causing her energy to be lower than it should be. Fatigue may be related to obesity, depression or many other causes. Labs will be ordered, and in the meanwhile, Katrina Evans will focus on self care including making healthy food choices, increasing physical activity and focusing on stress reduction. Check labs today.  - EKG 12-Lead -  Hemoglobin A1c - Insulin, random - Lipid Panel With LDL/HDL Ratio - T3 - T4, free - TSH - VITAMIN D 25 Hydroxy (Vit-D Deficiency, Fractures)  2. SOB (shortness of breath) on exertion Katrina Evans does feel that she gets out of breath more easily that she used to when she exercises. Katrina Evans's shortness of breath appears to be obesity related and  exercise induced. She has agreed to work on weight loss and gradually increase exercise to treat her exercise induced shortness of breath. Will continue to monitor closely. Check labs today.  - Lipid Panel With LDL/HDL Ratio  3. Essential hypertension Katrina Evans is working on healthy weight loss and exercise to improve blood pressure control. We will watch for signs of hypotension as she continues her lifestyle modifications. Continue current treatment plan.  4. Other hyperlipidemia Cardiovascular risk and specific lipid/LDL goals reviewed.  We discussed several lifestyle modifications today and Katrina Evans will continue to work on diet, exercise and weight loss efforts. Orders and follow up as documented in patient record.   Counseling Intensive lifestyle modifications are the first line treatment for this issue. Dietary changes: Increase soluble fiber. Decrease simple carbohydrates. Exercise changes: Moderate to vigorous-intensity aerobic activity 150 minutes per week if tolerated. Lipid-lowering medications: see documented in medical record. Check labs today.  - Lipid Panel With LDL/HDL Ratio  5. Pre-diabetes Katrina Evans will continue to work on weight loss, exercise, and decreasing simple carbohydrates to help decrease the risk of diabetes.  Check labs today.  - Hemoglobin A1c - Insulin, random  6. Vitamin D deficiency Low Vitamin D level contributes to fatigue and are associated with obesity, breast, and colon cancer. She agrees to continue to take OTC Vitamin D daily and will follow-up for routine testing of Vitamin D, at least 2-3 times per year to avoid over-replacement. Check labs today.  - VITAMIN D 25 Hydroxy (Vit-D Deficiency, Fractures)  7. Depression screen Katrina Evans had a positive depression screening. Depression is commonly associated with obesity and often results in emotional eating behaviors. We will monitor this closely and work on CBT to help improve the non-hunger  eating patterns. Referral to Psychology may be required if no improvement is seen as she continues in our clinic.  8. Class 1 obesity due to excess calories with serious comorbidity and body mass index (BMI) of 34.0 to 34.9 in adult  Katrina Evans is currently in the action stage of change and her goal is to continue with weight loss efforts. I recommend Katrina Evans begin the structured treatment plan as follows:  She has agreed to the Category 2 Plan.  Meal plan Mindful eating  Exercise goals: No exercise has been prescribed at this time.   Behavioral modification strategies: increasing lean protein intake, decreasing simple carbohydrates, increasing vegetables, increasing water intake, decreasing eating out, no skipping meals, meal planning and cooking strategies, keeping healthy foods in the home, and planning for success.  She was informed of the importance of frequent follow-up visits to maximize her success with intensive lifestyle modifications for her multiple health conditions. She was informed we would discuss her lab results at her next visit unless there is a critical issue that needs to be addressed sooner. Katrina Evans agreed to keep her next visit at the agreed upon time to discuss these results.  Objective:   Blood pressure 117/83, pulse 68, temperature 98.5 F (36.9 C), height 5\' 2"  (1.575 m), weight 189 lb (85.7 kg), SpO2 96 %. Body mass index is 34.57 kg/m.  EKG: Normal sinus rhythm, rate 66.  Indirect Calorimeter completed today shows a VO2 of 222 and a REE of 1539.  Her calculated basal metabolic rate is 1610 thus her basal metabolic rate is better than expected.  General: Cooperative, alert, well developed, in no acute distress. HEENT: Conjunctivae and lids unremarkable. Cardiovascular: Regular rhythm.  Lungs: Normal work of breathing. Neurologic: No focal deficits.   Lab Results  Component Value Date   CREATININE 0.78 08/16/2017   BUN 17 08/16/2017   NA 138  08/16/2017   K 3.6 08/16/2017   CL 102 08/16/2017   CO2 24 08/16/2017   No results found for: ALT, AST, GGT, ALKPHOS, BILITOT Lab Results  Component Value Date   HGBA1C 6.0 (H) 04/29/2021   Lab Results  Component Value Date   INSULIN 24.2 04/29/2021   Lab Results  Component Value Date   TSH 2.580 04/29/2021   Lab Results  Component Value Date   CHOL 182 04/29/2021   HDL 68 04/29/2021   LDLCALC 104 (H) 04/29/2021   TRIG 52 04/29/2021   Lab Results  Component Value Date   WBC 12.5 (H) 08/16/2017   HGB 14.5 08/16/2017   HCT 42.5 08/16/2017   MCV 93.2 08/16/2017   PLT 191 08/16/2017   No results found for: IRON, TIBC, FERRITIN Obesity Behavioral Intervention:   Approximately 15 minutes were spent on the discussion below.  ASK: We discussed the diagnosis of obesity with Katrina Evans today and Anyjah agreed to give [place] permission to discuss obesity behavioral modification therapy today.  ASSESS: Katrina Evans has the diagnosis of obesity and her BMI today is 34.7. Katrina Evans is in the action stage of change.   ADVISE: Kassidi was educated on the multiple health risks of obesity as well as the benefit of weight loss to improve her health. She was advised of the need for long term treatment and the importance of lifestyle modifications to improve her current health and to decrease her risk of future health problems.  AGREE: Multiple dietary modification options and treatment options were discussed and Katrina Evans agreed to follow the recommendations documented in the above note.  ARRANGE: Katrina Evans was educated on the importance of frequent visits to treat obesity as outlined per CMS and USPSTF guidelines and agreed to schedule her next follow up appointment today.  Attestation Statements:   Reviewed by clinician on day of visit: allergies, medications, problem list, medical history, surgical history, family history, social history, and previous encounter notes.  Coral Ceo, CMA, am acting as Location manager for [employer], DO.  I have reviewed the above documentation for accuracy and completeness, and I agree with the above. [name], DO

## 2021-05-07 ENCOUNTER — Encounter: Payer: Self-pay | Admitting: Registered"

## 2021-05-07 NOTE — Progress Notes (Signed)
On 04/30/21 patient completed a post core session of the Diabetes Prevention Program course virtually with [facility]. By the end of this session patients are able to complete the following objectives:   Virtual Visit via Video Note  I connected with Katrina Evans by a video enabled application and verified that I am speaking with the correct person using two identifiers.  Location: Patient: Home.  Provider: Office.   Learning Objectives: Identify how to maintain and/or continue working toward program goals for the remainder of the program.  Describe ways that food and activity tracking can assist them in maintaining/reaching program goals.  Identify progress they have made since the beginning of the program.   Goals:  Record weight taken outside of class.  Track foods and beverages eaten each day in the "Food and Activity Tracker," including calories and fat grams for each item.   Track activity type, minutes you were active, and distance you reached each day in the "Food and Activity Tracker."   Follow-Up Plan: Attend session 18 in two weeks.  Email completed "Food and Activity Trackers" before next session to be reviewed by Lifestyle Coach.

## 2021-05-12 DIAGNOSIS — N950 Postmenopausal bleeding: Secondary | ICD-10-CM | POA: Diagnosis not present

## 2021-05-12 DIAGNOSIS — D250 Submucous leiomyoma of uterus: Secondary | ICD-10-CM | POA: Diagnosis not present

## 2021-05-13 ENCOUNTER — Other Ambulatory Visit: Payer: Self-pay

## 2021-05-13 ENCOUNTER — Ambulatory Visit (INDEPENDENT_AMBULATORY_CARE_PROVIDER_SITE_OTHER): Payer: Medicare HMO | Admitting: Bariatrics

## 2021-05-13 ENCOUNTER — Encounter (INDEPENDENT_AMBULATORY_CARE_PROVIDER_SITE_OTHER): Payer: Self-pay | Admitting: Bariatrics

## 2021-05-13 VITALS — BP 130/85 | HR 85 | Temp 97.9°F | Ht 62.0 in | Wt 186.0 lb

## 2021-05-13 DIAGNOSIS — R7303 Prediabetes: Secondary | ICD-10-CM

## 2021-05-13 DIAGNOSIS — Z6834 Body mass index (BMI) 34.0-34.9, adult: Secondary | ICD-10-CM | POA: Diagnosis not present

## 2021-05-13 DIAGNOSIS — I10 Essential (primary) hypertension: Secondary | ICD-10-CM | POA: Diagnosis not present

## 2021-05-13 DIAGNOSIS — E6609 Other obesity due to excess calories: Secondary | ICD-10-CM | POA: Diagnosis not present

## 2021-05-14 ENCOUNTER — Encounter (HOSPITAL_BASED_OUTPATIENT_CLINIC_OR_DEPARTMENT_OTHER): Payer: Medicare HMO | Admitting: Registered"

## 2021-05-14 ENCOUNTER — Encounter: Payer: Self-pay | Admitting: Registered"

## 2021-05-14 DIAGNOSIS — R7303 Prediabetes: Secondary | ICD-10-CM

## 2021-05-14 NOTE — Progress Notes (Signed)
On 05/14/21 patient completed a post core session of the Diabetes Prevention Program course virtually with [facility]. By the end of this session patients are able to complete the following objectives:   Virtual Visit via Video Note  I connected with [name] by a video enabled application and verified that I am speaking with the correct person using two identifiers.  Location: Patient: Home. Provider: Office.   Learning Objectives: Describe the importance of having regular meals each day and how skipping meals can negatively affect food choices and weight.  Plan out balanced meals and snacks. List ways to avoid unplanned snacking.   Goals:  Record weight taken outside of class.  Track foods and beverages eaten each day in the "Food and Activity Tracker," including calories and fat grams for each item.   Track activity type, minutes you were active, and distance you reached each day in the "Food and Activity Tracker."   Follow-Up Plan: Attend next session.  Email completed "Food and Activity Trackers" before next session to be reviewed by Lifestyle Coach.

## 2021-05-19 DIAGNOSIS — Z01419 Encounter for gynecological examination (general) (routine) without abnormal findings: Secondary | ICD-10-CM | POA: Diagnosis not present

## 2021-05-19 DIAGNOSIS — M8580 Other specified disorders of bone density and structure, unspecified site: Secondary | ICD-10-CM | POA: Diagnosis not present

## 2021-05-19 DIAGNOSIS — R87611 Atypical squamous cells cannot exclude high grade squamous intraepithelial lesion on cytologic smear of cervix (ASC-H): Secondary | ICD-10-CM | POA: Diagnosis not present

## 2021-05-19 DIAGNOSIS — N950 Postmenopausal bleeding: Secondary | ICD-10-CM | POA: Diagnosis not present

## 2021-05-20 ENCOUNTER — Encounter (INDEPENDENT_AMBULATORY_CARE_PROVIDER_SITE_OTHER): Payer: Self-pay | Admitting: Bariatrics

## 2021-05-20 NOTE — Progress Notes (Signed)
Chief Complaint:   OBESITY Jeremy is here to discuss her progress with her obesity treatment plan along with follow-up of her obesity related diagnoses. Kaily is on the Category 2 Plan and states she is following her eating plan approximately 60-70% of the time. Kerah states she is walking 6,000-10,000 steps 5 times per week.  Today's visit was #: 2 Starting weight: 189 lbs Starting date: 04/29/2021 Today's weight: 186 lbs Today's date: 05/13/2021 Total lbs lost to date: 3 Total lbs lost since last in-office visit: 3  Interim History: Leander is down 3 lbs since her last visit. She states that some days were more difficult.  Subjective:   1. Pre-diabetes Starletta's last A1c was 6.0 and insulin level 24.2. She is not on medication.  2. Essential hypertension Pt's BP is reasonably well controlled.  Assessment/Plan:   1. Pre-diabetes Guinivere will continue to work on weight loss, exercise, and decreasing simple carbohydrates to help decrease the risk of diabetes. We discussed risk and benefits of Metformin.  Handouts: Pre-diabetes and Insulin Resistance  2. Essential hypertension Shalyce is working on healthy weight loss and exercise to improve blood pressure control. We will watch for signs of hypotension as she continues her lifestyle modifications. Continue current treatment plan.  3. Obesity, current BMI 34.1  Janeyah is currently in the action stage of change. As such, her goal is to continue with weight loss efforts. She has agreed to the Category 2 Plan.   Meal planning Pt will adhere closely to the plan. 04/29/2021 labs reviewed with pt. Increase water intake.  Exercise goals:  "Walk Away the Pounds"  Behavioral modification strategies: increasing lean protein intake, decreasing simple carbohydrates, increasing vegetables, increasing water intake, decreasing eating out, no skipping meals, meal planning and cooking strategies, keeping healthy foods  in the home, and planning for success.  Barry has agreed to follow-up with our clinic in 2 weeks. She was informed of the importance of frequent follow-up visits to maximize her success with intensive lifestyle modifications for her multiple health conditions.   Objective:   Blood pressure 130/85, pulse 85, temperature 97.9 F (36.6 C), height '5\' 2"'$  (1.575 m), weight 186 lb (84.4 kg), SpO2 96 %. Body mass index is 34.02 kg/m.  General: Cooperative, alert, well developed, in no acute distress. HEENT: Conjunctivae and lids unremarkable. Cardiovascular: Regular rhythm.  Lungs: Normal work of breathing. Neurologic: No focal deficits.   Lab Results  Component Value Date   CREATININE 0.78 08/16/2017   BUN 17 08/16/2017   NA 138 08/16/2017   K 3.6 08/16/2017   CL 102 08/16/2017   CO2 24 08/16/2017   No results found for: ALT, AST, GGT, ALKPHOS, BILITOT Lab Results  Component Value Date   HGBA1C 6.0 (H) 04/29/2021   Lab Results  Component Value Date   INSULIN 24.2 04/29/2021   Lab Results  Component Value Date   TSH 2.580 04/29/2021   Lab Results  Component Value Date   CHOL 182 04/29/2021   HDL 68 04/29/2021   LDLCALC 104 (H) 04/29/2021   TRIG 52 04/29/2021   Lab Results  Component Value Date   VD25OH 50.8 04/29/2021   Lab Results  Component Value Date   WBC 12.5 (H) 08/16/2017   HGB 14.5 08/16/2017   HCT 42.5 08/16/2017   MCV 93.2 08/16/2017   PLT 191 08/16/2017   No results found for: IRON, TIBC, FERRITIN  Obesity Behavioral Intervention:   Approximately 15 minutes were spent on the discussion  below.  ASK: We discussed the diagnosis of obesity with [name] today and Phaith agreed to give [place] permission to discuss obesity behavioral modification therapy today.  ASSESS: Shateara has the diagnosis of obesity and her BMI today is 34.1. Daisey is in the action stage of change.   ADVISE: Jaria was educated on the multiple health risks of  obesity as well as the benefit of weight loss to improve her health. She was advised of the need for long term treatment and the importance of lifestyle modifications to improve her current health and to decrease her risk of future health problems.  AGREE: Multiple dietary modification options and treatment options were discussed and Rayauna agreed to follow the recommendations documented in the above note.  ARRANGE: Raqual was educated on the importance of frequent visits to treat obesity as outlined per CMS and USPSTF guidelines and agreed to schedule her next follow up appointment today.  Attestation Statements:   Reviewed by clinician on day of visit: allergies, medications, problem list, medical history, surgical history, family history, social history, and previous encounter notes.  Coral Ceo, CMA, am acting as Location manager for [employer], DO.  I have reviewed the above documentation for accuracy and completeness, and I agree with the above. [name], DO

## 2021-05-24 ENCOUNTER — Other Ambulatory Visit: Payer: Self-pay | Admitting: Nurse Practitioner

## 2021-05-24 DIAGNOSIS — M8580 Other specified disorders of bone density and structure, unspecified site: Secondary | ICD-10-CM

## 2021-05-31 ENCOUNTER — Encounter (INDEPENDENT_AMBULATORY_CARE_PROVIDER_SITE_OTHER): Payer: Self-pay | Admitting: Bariatrics

## 2021-05-31 ENCOUNTER — Other Ambulatory Visit: Payer: Self-pay

## 2021-05-31 ENCOUNTER — Ambulatory Visit (INDEPENDENT_AMBULATORY_CARE_PROVIDER_SITE_OTHER): Payer: Medicare HMO | Admitting: Bariatrics

## 2021-05-31 VITALS — BP 145/73 | HR 75 | Temp 98.3°F | Ht 62.0 in | Wt 188.0 lb

## 2021-05-31 DIAGNOSIS — E6609 Other obesity due to excess calories: Secondary | ICD-10-CM | POA: Diagnosis not present

## 2021-05-31 DIAGNOSIS — E7849 Other hyperlipidemia: Secondary | ICD-10-CM

## 2021-05-31 DIAGNOSIS — Z6834 Body mass index (BMI) 34.0-34.9, adult: Secondary | ICD-10-CM

## 2021-05-31 DIAGNOSIS — I10 Essential (primary) hypertension: Secondary | ICD-10-CM

## 2021-05-31 NOTE — Patient Instructions (Signed)
Health Maintenance Due  Topic Date Due   COVID-19 Vaccine (1) Never done   Hepatitis C Screening  Never done   TETANUS/TDAP  Never done   COLONOSCOPY (Pts 45-71yr Insurance coverage will need to be confirmed)  Never done   Zoster Vaccines- Shingrix (1 of 2) Never done   DEXA SCAN  Never done   PNA vac Low Risk Adult (1 of 2 - PCV13) Never done   INFLUENZA VACCINE  05/24/2021    Depression screen PHQ 2/9 04/29/2021  Decreased Interest 3  Down, Depressed, Hopeless 2  PHQ - 2 Score 5  Altered sleeping 1  Tired, decreased energy 2  Change in appetite 2  Feeling bad or failure about yourself  2  Trouble concentrating 2  Moving slowly or fidgety/restless 1  Suicidal thoughts 1  PHQ-9 Score 16  Difficult doing work/chores Somewhat difficult

## 2021-05-31 NOTE — Progress Notes (Signed)
Chief Complaint:   OBESITY Katrina Evans is here to discuss her progress with her obesity treatment plan along with follow-up of her obesity related diagnoses. Katrina Evans is on the Category 2 Plan and states she is following her eating plan approximately 50% of the time. Katrina Evans states she is walking 5,000 to 7,000 steps 6 times per week.  Today's visit was #: 3 Starting weight: 189 lbs Starting date: 04/29/2021 Today's weight: 188 lbs Today's date: 05/31/2021 Total lbs lost to date: 1 Total lbs lost since last in-office visit: 0  Interim History: Katrina Evans is up 2 pounds since her last visit. She ate out in the evening. Katrina Evans did not do anything radical. Her body weight is up.  Subjective:   1. Essential hypertension Cardiovascular ROS: Katrina Evans is taking HCTZ.  BP Readings from Last 3 Encounters:  05/31/21 (!) 145/73  05/13/21 130/85  04/29/21 117/83   Lab Results  Component Value Date   CREATININE 0.78 08/16/2017   2. Other hyperlipidemia Katrina Evans has hyperlipidemia and is taking Lipitor. She has been trying to improve her cholesterol levels with intensive lifestyle modification including a low saturated fat diet, exercise and weight loss. She denies Katrina Evans chest pain, claudication or myalgias.  No results found for: ALT, AST, GGT, ALKPHOS, BILITOT Lab Results  Component Value Date   CHOL 182 04/29/2021   HDL 68 04/29/2021   LDLCALC 104 (H) 04/29/2021   TRIG 52 04/29/2021   Assessment/Plan:   1. Essential hypertension Katrina Evans agrees to continue taking her medications. She is working on healthy weight loss and exercise to improve blood pressure control. We will watch for signs of hypotension as she continues her lifestyle modifications.  2. Other hyperlipidemia Katrina Evans agrees to continue taking her medications. Cardiovascular risk and specific lipid/LDL goals reviewed.  We discussed several lifestyle modifications today and Katrina Evans will continue to work on  diet, exercise and weight loss efforts. Orders and follow up as documented in patient record.   Counseling Intensive lifestyle modifications are the first line treatment for this issue. Dietary changes: Increase soluble fiber. Decrease simple carbohydrates. Exercise changes: Moderate to vigorous-intensity aerobic activity 150 minutes per week if tolerated. Lipid-lowering medications: see documented in medical record.  3. Obesity, current BMI 34.5 Katrina Evans agrees to adhere more closely to the plan. She agrees to meal planning. We reviewed her labs: Lipid, vitamin D, A1c, insulin, and thyroid panel from 04/29/21. Katrina Evans agreed to increase her water intake. Recipes were given today.  Katrina Evans is currently in the action stage of change. As such, her goal is to continue with weight loss efforts. She has agreed to the Category 2 Plan.   Exercise goals:  As is.  Behavioral modification strategies: increasing lean protein intake, decreasing simple carbohydrates, increasing vegetables, increasing water intake, decreasing eating out, no skipping meals, meal planning and cooking strategies, keeping healthy foods in the home, and planning for success.  Katrina Evans has agreed to follow-up with our clinic in 2 weeks. She was informed of the importance of frequent follow-up visits to maximize her success with intensive lifestyle modifications for her multiple health conditions.   Objective:   Blood pressure (!) 145/73, pulse 75, temperature 98.3 F (36.8 C), height '5\' 2"'$  (1.575 m), weight 188 lb (85.3 kg), SpO2 95 %. Body mass index is 34.39 kg/m.  General: Cooperative, alert, well developed, in no acute distress. HEENT: Conjunctivae and lids unremarkable. Cardiovascular: Regular rhythm.  Lungs: Normal work of breathing. Neurologic: No focal deficits.   Lab Results  Component Value Date   CREATININE 0.78 08/16/2017   BUN 17 08/16/2017   NA 138 08/16/2017   K 3.6 08/16/2017   CL 102 08/16/2017    CO2 24 08/16/2017   No results found for: ALT, AST, GGT, ALKPHOS, BILITOT Lab Results  Component Value Date   HGBA1C 6.0 (H) 04/29/2021   Lab Results  Component Value Date   INSULIN 24.2 04/29/2021   Lab Results  Component Value Date   TSH 2.580 04/29/2021   Lab Results  Component Value Date   CHOL 182 04/29/2021   HDL 68 04/29/2021   LDLCALC 104 (H) 04/29/2021   TRIG 52 04/29/2021   Lab Results  Component Value Date   VD25OH 50.8 04/29/2021   Lab Results  Component Value Date   WBC 12.5 (H) 08/16/2017   HGB 14.5 08/16/2017   HCT 42.5 08/16/2017   MCV 93.2 08/16/2017   PLT 191 08/16/2017   No results found for: IRON, TIBC, FERRITIN  Obesity Behavioral Intervention:   Approximately 15 minutes were spent on the discussion below.  ASK: We discussed the diagnosis of obesity with Katrina Evans today and Katrina Evans agreed to give [place] permission to discuss obesity behavioral modification therapy today.  ASSESS: Katrina Evans has the diagnosis of obesity and her BMI today is 34.5. Katrina Evans is in the action stage of change.   ADVISE: Katrina Evans was educated on the multiple health risks of obesity as well as the benefit of weight loss to improve her health. She was advised of the need for long term treatment and the importance of lifestyle modifications to improve her current health and to decrease her risk of future health problems.  AGREE: Multiple dietary modification options and treatment options were discussed and Katrina Evans agreed to follow the recommendations documented in the above note.  ARRANGE: Katrina Evans was educated on the importance of frequent visits to treat obesity as outlined per CMS and USPSTF guidelines and agreed to schedule her next follow up appointment today.  Attestation Statements:   Reviewed by clinician on day of visit: allergies, medications, problem list, medical history, surgical history, family history, social history, and previous encounter  notes.  I, Marcille Blanco, CMA, am acting as Location manager for [employer]. Owens Shark, DO   I have reviewed the above documentation for accuracy and completeness, and I agree with the above. [name], DO

## 2021-06-01 ENCOUNTER — Encounter (INDEPENDENT_AMBULATORY_CARE_PROVIDER_SITE_OTHER): Payer: Self-pay | Admitting: Bariatrics

## 2021-06-01 DIAGNOSIS — N950 Postmenopausal bleeding: Secondary | ICD-10-CM | POA: Diagnosis not present

## 2021-06-14 ENCOUNTER — Ambulatory Visit (INDEPENDENT_AMBULATORY_CARE_PROVIDER_SITE_OTHER): Payer: Medicare HMO | Admitting: Bariatrics

## 2021-06-14 DIAGNOSIS — M1711 Unilateral primary osteoarthritis, right knee: Secondary | ICD-10-CM | POA: Diagnosis not present

## 2021-06-18 ENCOUNTER — Ambulatory Visit (HOSPITAL_BASED_OUTPATIENT_CLINIC_OR_DEPARTMENT_OTHER): Payer: Medicare HMO | Admitting: Registered"

## 2021-06-18 DIAGNOSIS — R7303 Prediabetes: Secondary | ICD-10-CM

## 2021-06-22 ENCOUNTER — Other Ambulatory Visit: Payer: Self-pay

## 2021-06-22 ENCOUNTER — Ambulatory Visit
Admission: RE | Admit: 2021-06-22 | Discharge: 2021-06-22 | Disposition: A | Discharge status: Home / Self Care | Payer: Medicare HMO | Source: Ambulatory Visit | Attending: Nurse Practitioner | Admitting: Nurse Practitioner

## 2021-06-22 ENCOUNTER — Other Ambulatory Visit: Payer: Medicare HMO

## 2021-06-22 DIAGNOSIS — Z0100 Encounter for examination of eyes and vision without abnormal findings: Secondary | ICD-10-CM | POA: Diagnosis not present

## 2021-06-22 DIAGNOSIS — M8580 Other specified disorders of bone density and structure, unspecified site: Secondary | ICD-10-CM

## 2021-06-22 DIAGNOSIS — M8589 Other specified disorders of bone density and structure, multiple sites: Secondary | ICD-10-CM | POA: Diagnosis not present

## 2021-06-22 DIAGNOSIS — Z780 Asymptomatic menopausal state: Secondary | ICD-10-CM | POA: Diagnosis not present

## 2021-06-23 ENCOUNTER — Encounter: Payer: Self-pay | Admitting: Registered"

## 2021-06-23 NOTE — Progress Notes (Signed)
On 06/18/21 patient completed a post core session of the Diabetes Prevention Program course virtually with [facility]. By the end of this session patients are able to complete the following objectives:   Virtual Visit via Video Note  I connected with Katrina Evans by a video enabled application and verified that I am speaking with the correct person using two identifiers.  Location: Patient: Home.  Provider: Office.   Learning Objectives: Identify which foods contain carbohydrates.  List functions for carbohydrates on the body.  Describe the relationship between carbohydrate intake and blood sugar.  Create balanced snack choices.   Goals:  Record weight taken outside of class.  Track foods and beverages eaten each day in the "Food and Activity Tracker," including calories and fat grams for each item.   Track activity type, minutes you were active, and distance you reached each day in the "Food and Activity Tracker."   Follow-Up Plan: Attend next session.  Email completed "Food and Activity Trackers" before next session to be reviewed by Lifestyle Coach.

## 2021-06-29 ENCOUNTER — Encounter: Payer: Self-pay | Admitting: Registered"

## 2021-06-29 DIAGNOSIS — E7800 Pure hypercholesterolemia, unspecified: Secondary | ICD-10-CM | POA: Diagnosis not present

## 2021-06-29 DIAGNOSIS — R7303 Prediabetes: Secondary | ICD-10-CM | POA: Diagnosis not present

## 2021-06-29 DIAGNOSIS — F329 Major depressive disorder, single episode, unspecified: Secondary | ICD-10-CM | POA: Diagnosis not present

## 2021-06-29 DIAGNOSIS — I10 Essential (primary) hypertension: Secondary | ICD-10-CM | POA: Diagnosis not present

## 2021-06-29 DIAGNOSIS — Z0000 Encounter for general adult medical examination without abnormal findings: Secondary | ICD-10-CM | POA: Diagnosis not present

## 2021-06-29 DIAGNOSIS — E559 Vitamin D deficiency, unspecified: Secondary | ICD-10-CM | POA: Diagnosis not present

## 2021-06-29 DIAGNOSIS — K219 Gastro-esophageal reflux disease without esophagitis: Secondary | ICD-10-CM | POA: Diagnosis not present

## 2021-06-29 DIAGNOSIS — M8580 Other specified disorders of bone density and structure, unspecified site: Secondary | ICD-10-CM | POA: Diagnosis not present

## 2021-06-29 LAB — HEMOGLOBIN A1C: Hemoglobin A1C: 5.9

## 2021-06-29 LAB — BASIC METABOLIC PANEL
BUN: 20 (ref 4–21)
CO2: 32 — AB (ref 13–22)
Chloride: 104 (ref 99–108)
Creatinine: 0.8 (ref 0.5–1.1)
Glucose: 87
Potassium: 3.5 (ref 3.4–5.3)
Sodium: 142 (ref 137–147)

## 2021-06-29 LAB — LIPID PANEL
Cholesterol: 182 (ref 0–200)
HDL: 65 (ref 35–70)
LDL Cholesterol: 101
LDl/HDL Ratio: 2.8
Triglycerides: 87 (ref 40–160)

## 2021-06-29 LAB — COMPREHENSIVE METABOLIC PANEL
Albumin: 3.9 (ref 3.5–5.0)
Calcium: 9.6 (ref 8.7–10.7)
GFR calc non Af Amer: 80

## 2021-06-29 LAB — HEPATIC FUNCTION PANEL
ALT: 22 (ref 7–35)
AST: 17 (ref 13–35)
Alkaline Phosphatase: 57 (ref 25–125)
Bilirubin, Total: 0.9

## 2021-06-29 LAB — VITAMIN D 25 HYDROXY (VIT D DEFICIENCY, FRACTURES): Vit D, 25-Hydroxy: 29

## 2021-06-29 LAB — MICROALBUMIN, URINE: Microalb, Ur: <0.7

## 2021-06-29 NOTE — Progress Notes (Signed)
On 02/19/21 patient completed Core Session 10 of Diabetes Prevention Program course virtually with [facility]. The following learning objectives were met by the patient during this class:   Virtual Visit via Video Note  I connected with Katrina Evans by a video enabled application and verified that I am speaking with the correct person using two identifiers.  Location: Patient: Home.  Provider: Office.   Learning Objectives: List and describe the four keys for healthy eating out.  Give examples of how to apply these keys at the type of restaurants that the participants go to regularly.  Make an appropriate meal selection from a restaurant menu.  Demonstrate how to ask for a substitute item using assertive language and a polite tone of voice.    Goals:  Record weight taken outside of class.  Track foods and beverages eaten each day in the "Food and Activity Tracker," including calories and fat grams for each item.   Track activity type, minutes you were active, and distance you reached each day in the "Food and Activity Tracker."  Set aside one 20 to 30-minute block of time every day or find two or more periods of 10 to15 minutes each for physical activity.  Utilize positive action plan and complete questions on "To Do List."   Follow-Up Plan: Attend Core Session 11.  Email completed "Food and Activity Tracker" to be reviewed by Lifestyle Coach.

## 2021-06-30 DIAGNOSIS — N950 Postmenopausal bleeding: Secondary | ICD-10-CM | POA: Diagnosis not present

## 2021-06-30 DIAGNOSIS — R87611 Atypical squamous cells cannot exclude high grade squamous intraepithelial lesion on cytologic smear of cervix (ASC-H): Secondary | ICD-10-CM | POA: Diagnosis not present

## 2021-07-01 ENCOUNTER — Encounter (INDEPENDENT_AMBULATORY_CARE_PROVIDER_SITE_OTHER): Payer: Self-pay | Admitting: Bariatrics

## 2021-07-01 ENCOUNTER — Ambulatory Visit (INDEPENDENT_AMBULATORY_CARE_PROVIDER_SITE_OTHER): Payer: Medicare HMO | Admitting: Bariatrics

## 2021-07-01 ENCOUNTER — Other Ambulatory Visit: Payer: Self-pay

## 2021-07-01 VITALS — BP 129/91 | HR 89 | Temp 91.0°F | Ht 62.0 in | Wt 187.0 lb

## 2021-07-01 DIAGNOSIS — E7849 Other hyperlipidemia: Secondary | ICD-10-CM | POA: Diagnosis not present

## 2021-07-01 DIAGNOSIS — E6609 Other obesity due to excess calories: Secondary | ICD-10-CM | POA: Diagnosis not present

## 2021-07-01 DIAGNOSIS — Z6834 Body mass index (BMI) 34.0-34.9, adult: Secondary | ICD-10-CM

## 2021-07-01 DIAGNOSIS — F5089 Other specified eating disorder: Secondary | ICD-10-CM | POA: Diagnosis not present

## 2021-07-01 DIAGNOSIS — R7303 Prediabetes: Secondary | ICD-10-CM

## 2021-07-01 DIAGNOSIS — E66811 Obesity, class 1: Secondary | ICD-10-CM

## 2021-07-02 ENCOUNTER — Encounter: Payer: Medicare HMO | Attending: Physician Assistant | Admitting: Registered"

## 2021-07-02 ENCOUNTER — Encounter: Payer: Self-pay | Admitting: Registered"

## 2021-07-02 DIAGNOSIS — R7303 Prediabetes: Secondary | ICD-10-CM | POA: Diagnosis not present

## 2021-07-02 NOTE — Progress Notes (Signed)
On 07/02/21 patient completed a post core session of the Diabetes Prevention Program course virtually with [facility]. By the end of this session patients are able to complete the following objectives:   Virtual Visit via Video Note:  I connected with Katrina Evans by a video enabled application and verified that I am speaking with the correct person using two identifiers.  Location: Patient: Home.  Provider: Office.   Learning Objectives: Describe the difference between a lapse and a relapse. List steps to prevent a lapse from becoming a relapse.  Identify situations that increase risk of having a lapse.  Make a plan to help prevent lapses and recover after a lapse has occurred.   Goals:  Record weight taken outside of class.  Track foods and beverages eaten each day in the "Food and Activity Tracker," including calories and fat grams for each item.   Track activity type, minutes you were active, and distance you reached each day in the "Food and Activity Tracker."   Follow-Up Plan: Attend next session.  Email completed "Food and Activity Trackers" before next session to be reviewed by Lifestyle Coach.

## 2021-07-05 ENCOUNTER — Encounter (INDEPENDENT_AMBULATORY_CARE_PROVIDER_SITE_OTHER): Payer: Self-pay | Admitting: Bariatrics

## 2021-07-05 NOTE — Progress Notes (Signed)
Chief Complaint:   OBESITY Katrina Evans is here to discuss her progress with her obesity treatment plan along with follow-up of her obesity related diagnoses. Katrina Evans is on the Category 2 Plan and states she is following her eating plan approximately 35% of the time. Katrina Evans states she is doing 0 minutes 0 times per week.  Today's visit was #: 4 Starting weight: 189 lbs Starting date: 04/29/2021 Today's weight: 187 lbs Today's date: 07/01/2021 Total lbs lost to date: 2 lbs Total lbs lost since last in-office visit: 1 lb  Interim History: Katrina Evans is down 1 additional lb. She eats chips and other snacks in the evening. She is getting in enough protein.  Subjective:   1. Prediabetes Katrina Evans is currently not on medications.  2. Other hyperlipidemia Katrina Evans is currently taking Lipitor.  3. Other disorder of eating Katrina Evans notes some nighttime stress/emotional eating.  Assessment/Plan:   1. Prediabetes Katrina Evans will continue to work on weight loss, exercise, and decreasing simple carbohydrates to help decrease the risk of diabetes. She will increase activities and exercise.  2. Other hyperlipidemia Cardiovascular risk and specific lipid/LDL goals reviewed.  We discussed several lifestyle modifications today and Katrina Evans will continue to work on diet, exercise and weight loss efforts. Katrina Evans will continue Lipitor. Orders and follow up as documented in patient record.   Counseling Intensive lifestyle modifications are the first line treatment for this issue. Dietary changes: Increase soluble fiber. Decrease simple carbohydrates. Exercise changes: Moderate to vigorous-intensity aerobic activity 150 minutes per week if tolerated. Lipid-lowering medications: see documented in medical record.   3. Other disorder of eating Katrina Evans will consider medications for stress eating ( options discussed ). Behavior modification techniques were discussed today to help Katrina Evans  deal with her emotional/non-hunger eating behaviors.  Orders and follow up as documented in patient record.    4. Obesity, current BMI 34.3 Katrina Evans is currently in the action stage of change. As such, her goal is to continue with weight loss efforts. She has agreed to the Category 2 Plan.   [name] will adhere closely to the plan 80-85% and she will meal plan.  Exercise goals: No exercise has been prescribed at this time.  Behavioral modification strategies: increasing lean protein intake, decreasing simple carbohydrates, increasing vegetables, increasing water intake, decreasing eating out, no skipping meals, meal planning and cooking strategies, keeping healthy foods in the home, and planning for success.  Katrina Evans has agreed to follow-up with our clinic in 4 weeks with Katrina Marble, NP or Katrina Evans, PAC. She was informed of the importance of frequent follow-up visits to maximize her success with intensive lifestyle modifications for her multiple health conditions.   Objective:   Blood pressure (!) 129/91, pulse 89, temperature (!) 91 F (32.8 C), height '5\' 2"'$  (1.575 m), weight 187 lb (84.8 kg), SpO2 98 %. Body mass index is 34.2 kg/m.  General: Cooperative, alert, well developed, in no acute distress. HEENT: Conjunctivae and lids unremarkable. Cardiovascular: Regular rhythm.  Lungs: Normal work of breathing. Neurologic: No focal deficits.   Lab Results  Component Value Date   CREATININE 0.78 08/16/2017   BUN 17 08/16/2017   NA 138 08/16/2017   K 3.6 08/16/2017   CL 102 08/16/2017   CO2 24 08/16/2017   No results found for: ALT, AST, GGT, ALKPHOS, BILITOT Lab Results  Component Value Date   HGBA1C 6.0 (H) 04/29/2021   Lab Results  Component Value Date   INSULIN 24.2 04/29/2021   Lab Results  Component  Value Date   TSH 2.580 04/29/2021   Lab Results  Component Value Date   CHOL 182 04/29/2021   HDL 68 04/29/2021   LDLCALC 104 (H) 04/29/2021   TRIG 52  04/29/2021   Lab Results  Component Value Date   VD25OH 50.8 04/29/2021   Lab Results  Component Value Date   WBC 12.5 (H) 08/16/2017   HGB 14.5 08/16/2017   HCT 42.5 08/16/2017   MCV 93.2 08/16/2017   PLT 191 08/16/2017   No results found for: IRON, TIBC, FERRITIN  Obesity Behavioral Intervention:   Approximately 15 minutes were spent on the discussion below.  ASK: We discussed the diagnosis of obesity with Katrina Evans today and Katrina Evans agreed to give [place] permission to discuss obesity behavioral modification therapy today.  ASSESS: Katrina Evans has the diagnosis of obesity and her BMI today is 34.3. Katrina Evans is in the action stage of change.   ADVISE: Katrina Evans was educated on the multiple health risks of obesity as well as the benefit of weight loss to improve her health. She was advised of the need for long term treatment and the importance of lifestyle modifications to improve her current health and to decrease her risk of future health problems.  AGREE: Multiple dietary modification options and treatment options were discussed and Katrina Evans agreed to follow the recommendations documented in the above note.  ARRANGE: Katrina Evans was educated on the importance of frequent visits to treat obesity as outlined per CMS and USPSTF guidelines and agreed to schedule her next follow up appointment today.  Attestation Statements:   Reviewed by clinician on day of visit: allergies, medications, problem list, medical history, surgical history, family history, social history, and previous encounter notes.  I, Lizbeth Bark, RMA, am acting as Location manager for [employer], DO.   I have reviewed the above documentation for accuracy and completeness, and I agree with the above. [name], DO

## 2021-07-20 DIAGNOSIS — R87611 Atypical squamous cells cannot exclude high grade squamous intraepithelial lesion on cytologic smear of cervix (ASC-H): Secondary | ICD-10-CM | POA: Diagnosis not present

## 2021-07-20 DIAGNOSIS — N950 Postmenopausal bleeding: Secondary | ICD-10-CM | POA: Diagnosis not present

## 2021-07-27 ENCOUNTER — Encounter (INDEPENDENT_AMBULATORY_CARE_PROVIDER_SITE_OTHER): Payer: Self-pay

## 2021-07-29 ENCOUNTER — Other Ambulatory Visit: Payer: Self-pay

## 2021-07-29 ENCOUNTER — Ambulatory Visit (INDEPENDENT_AMBULATORY_CARE_PROVIDER_SITE_OTHER): Payer: Medicare HMO | Admitting: Adult Health

## 2021-07-29 ENCOUNTER — Encounter (INDEPENDENT_AMBULATORY_CARE_PROVIDER_SITE_OTHER): Payer: Self-pay | Admitting: Adult Health

## 2021-07-29 VITALS — BP 134/74 | HR 73 | Temp 98.1°F | Ht 62.0 in | Wt 190.0 lb

## 2021-07-29 DIAGNOSIS — I10 Essential (primary) hypertension: Secondary | ICD-10-CM

## 2021-07-29 DIAGNOSIS — R7303 Prediabetes: Secondary | ICD-10-CM

## 2021-07-29 DIAGNOSIS — Z6834 Body mass index (BMI) 34.0-34.9, adult: Secondary | ICD-10-CM | POA: Diagnosis not present

## 2021-07-29 DIAGNOSIS — E6609 Other obesity due to excess calories: Secondary | ICD-10-CM | POA: Diagnosis not present

## 2021-07-29 NOTE — Progress Notes (Signed)
Chief Complaint:   OBESITY Katrina Evans is here to discuss her progress with her obesity treatment plan along with follow-up of her obesity related diagnoses. Katrina Evans is on the Category 2 Plan and states she is following her eating plan approximately 50% of the time. Katrina Evans states she is walking for 15 minutes 3-4 times per week.  Today's visit was #: 5 Starting weight: 189 lbs Starting date: 04/29/2021 Today's weight: 190 lbs Today's date: 07/29/2021 Total lbs lost to date: 0 Total lbs lost since last in-office visit: 0  Interim History: Katrina Evans says that when she eats off plan - usually eating out for dinner - at least 3 times per week. She had labs drawn with her PCP - [facility] - in early September 2022- will request results.  Subjective:   1. Prediabetes On 04/29/2021 - A1c 6.0 with elevated insulin - 24.2. Her mother was diabetic.  She has never been on BG lowering medications.   She reports increased boredom eating.  2. Essential hypertension BP/HR at goal. She is on HCTZ 25 mg daily.  Assessment/Plan:   1. Prediabetes Check labs at next office visit.  2. Essential hypertension Continue diuretic as directed.   3. Obesity, current BMI 34.8  Katrina Evans is currently in the action stage of change. As such, her goal is to continue with weight loss efforts. She has agreed to the Category 2 Plan.   Dinner calories 400-500 and 35+ grams of protein with 600-700 snack calories.  Exercise goals:  As is.   Tracks 6000-8000 steps.  Behavioral modification strategies: increasing lean protein intake, decreasing simple carbohydrates, meal planning and cooking strategies, keeping healthy foods in the home, and planning for success.  Katrina Evans has agreed to follow-up with our clinic in 2-3 weeks, fasting. She was informed of the importance of frequent follow-up visits to maximize her success with intensive lifestyle modifications for her multiple health conditions.    Objective:   Blood pressure 134/74, pulse 73, temperature 98.1 F (36.7 C), height 5\' 2"  (1.575 m), weight 190 lb (86.2 kg), SpO2 98 %. Body mass index is 34.75 kg/m.  General: Cooperative, alert, well developed, in no acute distress. HEENT: Conjunctivae and lids unremarkable. Cardiovascular: Regular rhythm.  Lungs: Normal work of breathing. Neurologic: No focal deficits.   Lab Results  Component Value Date   CREATININE 0.78 08/16/2017   BUN 17 08/16/2017   NA 138 08/16/2017   K 3.6 08/16/2017   CL 102 08/16/2017   CO2 24 08/16/2017   Lab Results  Component Value Date   HGBA1C 6.0 (H) 04/29/2021   Lab Results  Component Value Date   INSULIN 24.2 04/29/2021   Lab Results  Component Value Date   TSH 2.580 04/29/2021   Lab Results  Component Value Date   CHOL 182 04/29/2021   HDL 68 04/29/2021   LDLCALC 104 (H) 04/29/2021   TRIG 52 04/29/2021   Lab Results  Component Value Date   VD25OH 50.8 04/29/2021   Lab Results  Component Value Date   WBC 12.5 (H) 08/16/2017   HGB 14.5 08/16/2017   HCT 42.5 08/16/2017   MCV 93.2 08/16/2017   PLT 191 08/16/2017   Attestation Statements:   Reviewed by clinician on day of visit: allergies, medications, problem list, medical history, surgical history, family history, social history, and previous encounter notes.  I, Water quality scientist, CMA, am acting as Location manager for Mina Marble, NP.  I have reviewed the above documentation for accuracy and completeness,  and I agree with the above. -  Ermon Sagan d. Jaqwan Wieber, NP-C

## 2021-08-05 DIAGNOSIS — M1711 Unilateral primary osteoarthritis, right knee: Secondary | ICD-10-CM | POA: Diagnosis not present

## 2021-08-06 ENCOUNTER — Encounter: Payer: Medicare HMO | Attending: Physician Assistant | Admitting: Registered"

## 2021-08-06 DIAGNOSIS — R7303 Prediabetes: Secondary | ICD-10-CM | POA: Insufficient documentation

## 2021-08-09 ENCOUNTER — Telehealth (INDEPENDENT_AMBULATORY_CARE_PROVIDER_SITE_OTHER): Payer: Self-pay

## 2021-08-09 NOTE — Telephone Encounter (Signed)
Call to clinic to request recent labs Spoke with Lattie Haw, she states that she will fax them.

## 2021-08-10 ENCOUNTER — Other Ambulatory Visit (INDEPENDENT_AMBULATORY_CARE_PROVIDER_SITE_OTHER): Payer: Self-pay | Admitting: Adult Health

## 2021-08-12 DIAGNOSIS — N950 Postmenopausal bleeding: Secondary | ICD-10-CM | POA: Diagnosis not present

## 2021-08-12 DIAGNOSIS — Z01818 Encounter for other preprocedural examination: Secondary | ICD-10-CM | POA: Diagnosis not present

## 2021-08-12 NOTE — H&P (Signed)
Katrina Evans is an 70 y.o. G3P3 who is admitted for Hysteroscopy with Dilation and Curettage and Myosure polypectomy for suspected endometrial polyp and postmenopausal bleeding.  Reports postmenopausal bleeding at the end of 02/2021 and again 7/11-7/19/2022. The bleeding was light similar to a light period. Had another small episode since July as well. Pap smear revealed ASC-H pap smear which was evaluated with colposcopy. Cervical biopsy taken at 12 o'clock and ECC collected which were benign. On TVUS, there was an 79mm area in the upper uterine segment that appeared consistent with an endometrial polyp.  Work-up: Colposcopy (06/30/21): Cervical biopsy 12 o'clock - benign squamous mucosa, ECC - minute fragments of benign endocervical glands, squamous metaplasia, blood, and mucus Pap smear (05/19/21): ASC-H pap smear Endometrial biopsy (06/01/21): Scant atrophic endometrium. No hyperplasia or carcinoma  TVUS (05/12/21):  Uterus 8.4 x 4.1 x 5.5cm, anteverted Heterogenous myometrium. Posterior right fundal leiomyoma 2.5 x 2.2 x 2.8cm, extends submucosal. No discrete masses seen. Endometrium thickness 74mm. Trace endometrial fluid. Question polyp centrally within endometrial canal at upper uterine segment 11 x 4mm on sagittal images, not definitely imaged transverse. Bilateral ovaries normal. No pelvic free fluid. No adnexal masses.  Impression: Submucosal leiomyoma at right uterine fundus 2.8cm in diameter. Suspected 75mm endometrial polyp at upper uterine segment.  Patient Active Problem List   Diagnosis Date Noted   Class 1 obesity due to excess calories with body mass index (BMI) of 34.0 to 34.9 in adult 04/29/2021   Prediabetes 04/29/2021   Essential hypertension 04/29/2021   Hyperlipidemia 04/29/2021   MEDICAL/FAMILY/SOCIAL HX: No LMP recorded. Patient is postmenopausal.    Past Medical History:  Diagnosis Date   Edema of both lower extremities    High cholesterol    Hypertension     Joint pain    Obesity    Pre-diabetes    SOB (shortness of breath)    Vitamin D deficiency     Past Surgical History:  Procedure Laterality Date   BREAST BIOPSY Right    No Scar   LAPAROSCOPIC APPENDECTOMY N/A 08/17/2017   Procedure: APPENDECTOMY LAPAROSCOPIC;  Surgeon: Excell Seltzer, MD;  Location: [facility];  Service: General;  Laterality: N/A;   laser vein surgery      Family History  Problem Relation Age of Onset   Diabetes Mother    High blood pressure Mother    High Cholesterol Mother    Stroke Mother    Cancer Mother    Depression Mother    Schizophrenia Mother    Obesity Mother    High blood pressure Father    High Cholesterol Father    Heart disease Father    Cancer Father    Alcoholism Father    Breast cancer Neg Hx     Social History:  reports that she quit smoking about 17 years ago. She has a 35.00 pack-year smoking history. She has never used smokeless tobacco. She reports current alcohol use. She reports that she does not use drugs.  ALLERGIES/MEDS:  Allergies:  Allergies  Allergen Reactions   Diclofenac Nausea Only    Pt stated, "Upset my stomach"    No medications prior to admission.     Review of Systems  Constitutional: Negative.   HENT: Negative.    Eyes: Negative.   Respiratory: Negative.    Cardiovascular: Negative.   Gastrointestinal: Negative.   Genitourinary: Negative.   Musculoskeletal: Negative.   Skin: Negative.   Neurological: Negative.   Endo/Heme/Allergies: Negative.   Psychiatric/Behavioral: Negative.  There were no vitals taken for this visit. Gen:  NAD, pleasant and cooperative Cardio:  RRR Pulm:  CTAB, no wheezes/rales/rhonchi Abd:  Soft, non-distended, non-tender throughout, no rebound/guarding Ext:  No bilateral LE edema, no bilateral calf tenderness Pelvic: Labia - unremarkable, vagina - pink moist mucosa, no lesions or abnormal discharge, atrophic changes noted, cervix - no discharge or lesions or  CMT  No results found for this or any previous visit (from the past 24 hour(s)).  No results found.   ASSESSMENT/PLAN: Katrina Evans is a 70 y.o. G3P3 who is admitted for Hysteroscopy with Dilation and Curettage and Myosure polypectomy for suspected endometrial polyp and postmenopausal bleeding.  - Admit to [facility] - Admit labs (CBC, BMP, T&S, COVID screen) - Diet:  ERAS pathway/per anesthesia - IVF:  Per anesthesia - VTE Prophylaxis:  SCDs - Antibiotics: none - D/C home same day  Consents: I discussed with the patient that this surgery is performed to look inside the uterus and remove the uterine lining.  Prior to surgery, the risks and benefits of the surgery, as well as alternative treatments, have been discussed.  The risks include, but are not limited to bleeding, including the need for a blood transfusion, infection, damage to organs and tissues, including uterine perforation, requiring additional surgery, postoperative pain, short-term and long-term, failure of the procedure to control symptoms, need for hysterectomy to control bleeding, fluid overload, which could create electrolyte abnormalities and the need to stop the procedure before completion, inability to safely complete the procedure, deep vein thrombosis and/or pulmonary embolism, painful intercourse, complications the course of which cannot be predicted or prevented, and death.  Patient was consented for blood products.  The patient is aware that bleeding may result in the need for a blood transfusion which includes risk of transmission of HIV (1:2 million), Hepatitis C (1:2 million), and Hepatitis B (1:200 thousand) and transfusion reaction.  Patient voiced understanding of the above risks as well as understanding of indications for blood transfusion.   [name], DO [phone] (office)

## 2021-08-13 ENCOUNTER — Encounter: Payer: Self-pay | Admitting: Registered"

## 2021-08-13 NOTE — Progress Notes (Signed)
On 08/06/21 patient completed the Diabetes Prevention Program course virtually with [facility]. By the end of this session patients are able to complete the following objectives:   Virtual Visit via Video Note  I connected with Katrina Evans by a video enabled application and verified that I am speaking with the correct person using two identifiers.  Location: Patient: Home.  Provider: Office.    Learning Objectives: Define fiber and describe the difference between insoluble and soluble fiber  List foods that are good sources of fiber Explain the health benefits of fiber  Describe ways to increase volume of meals and snacks while staying within fat goal.   Goals:  Record weight taken outside of class.  Track foods and beverages eaten each day in the "Food and Activity Tracker," including calories and fat grams for each item.   Track activity type, minutes you were active, and distance you reached each day in the "Food and Activity Tracker."   Follow-Up Plan: Attend next session.  Email completed "Food and Activity Trackers" before next session to be reviewed by Lifestyle Coach.

## 2021-08-19 ENCOUNTER — Encounter (HOSPITAL_BASED_OUTPATIENT_CLINIC_OR_DEPARTMENT_OTHER): Payer: Self-pay | Admitting: Obstetrics and Gynecology

## 2021-08-19 ENCOUNTER — Other Ambulatory Visit: Payer: Self-pay

## 2021-08-19 ENCOUNTER — Ambulatory Visit (INDEPENDENT_AMBULATORY_CARE_PROVIDER_SITE_OTHER): Payer: Medicare HMO | Admitting: Physician Assistant

## 2021-08-19 NOTE — Progress Notes (Signed)
Spoke w/ via phone for pre-op interview--- [employer]---- Labs preop in [place].  Current EKG in Epic.            Lab results------ COVID test -----patient states asymptomatic no test needed Arrive at ------- 1045 NPO after MN NO Solid Food.  Clear liquids from MN until---0945 Med rec completed Medications to take morning of surgery ----- NONE Diabetic medication ----- Patient instructed no nail polish to be worn day of surgery Patient instructed to bring photo id and insurance card day of surgery Patient aware to have Driver (ride ) / caregiver    for 24 hours after surgery  Patient Special Instructions ----- Pre-Op special Istructions ----- Patient verbalized understanding of instructions that were given at this phone interview. Patient denies shortness of breath, chest pain, fever, cough at this phone interview.

## 2021-08-23 ENCOUNTER — Encounter (HOSPITAL_COMMUNITY)
Admission: RE | Admit: 2021-08-23 | Discharge: 2021-08-23 | Disposition: A | Discharge status: Home / Self Care | Payer: Medicare HMO | Source: Ambulatory Visit | Attending: Obstetrics and Gynecology | Admitting: Obstetrics and Gynecology

## 2021-08-23 ENCOUNTER — Other Ambulatory Visit: Payer: Self-pay

## 2021-08-23 DIAGNOSIS — N950 Postmenopausal bleeding: Secondary | ICD-10-CM | POA: Diagnosis not present

## 2021-08-23 DIAGNOSIS — Z01812 Encounter for preprocedural laboratory examination: Secondary | ICD-10-CM | POA: Diagnosis not present

## 2021-08-23 DIAGNOSIS — I10 Essential (primary) hypertension: Secondary | ICD-10-CM | POA: Diagnosis not present

## 2021-08-23 LAB — CBC
HCT: 42.6 % (ref 36.0–46.0)
Hemoglobin: 14 g/dL (ref 12.0–15.0)
MCH: 32 pg (ref 26.0–34.0)
MCHC: 32.9 g/dL (ref 30.0–36.0)
MCV: 97.5 fL (ref 80.0–100.0)
Platelets: 164 10*3/uL (ref 150–400)
RBC: 4.37 MIL/uL (ref 3.87–5.11)
RDW: 12.7 % (ref 11.5–15.5)
WBC: 4.4 10*3/uL (ref 4.0–10.5)
nRBC: 0 % (ref 0.0–0.2)

## 2021-08-23 LAB — BASIC METABOLIC PANEL
Anion gap: 6 (ref 5–15)
BUN: 23 mg/dL (ref 8–23)
CO2: 28 mmol/L (ref 22–32)
Calcium: 9 mg/dL (ref 8.9–10.3)
Chloride: 104 mmol/L (ref 98–111)
Creatinine, Ser: 0.61 mg/dL (ref 0.44–1.00)
GFR, Estimated: >60 mL/min (ref >60)
Glucose, Bld: 95 mg/dL (ref 70–99)
Potassium: 3.6 mmol/L (ref 3.5–5.1)
Sodium: 138 mmol/L (ref 135–145)

## 2021-08-26 ENCOUNTER — Ambulatory Visit (HOSPITAL_BASED_OUTPATIENT_CLINIC_OR_DEPARTMENT_OTHER)
Admission: RE | Admit: 2021-08-26 | Discharge: 2021-08-26 | Disposition: A | Discharge status: Home / Self Care | Payer: Medicare HMO | Source: Ambulatory Visit | Attending: Obstetrics and Gynecology | Admitting: Obstetrics and Gynecology

## 2021-08-26 ENCOUNTER — Encounter (HOSPITAL_BASED_OUTPATIENT_CLINIC_OR_DEPARTMENT_OTHER)
Admission: RE | Disposition: A | Discharge status: Home / Self Care | Payer: Self-pay | Source: Ambulatory Visit | Attending: Obstetrics and Gynecology

## 2021-08-26 ENCOUNTER — Encounter (HOSPITAL_BASED_OUTPATIENT_CLINIC_OR_DEPARTMENT_OTHER): Payer: Self-pay | Admitting: Obstetrics and Gynecology

## 2021-08-26 ENCOUNTER — Ambulatory Visit (HOSPITAL_BASED_OUTPATIENT_CLINIC_OR_DEPARTMENT_OTHER): Payer: Medicare HMO | Admitting: Anesthesiology

## 2021-08-26 ENCOUNTER — Other Ambulatory Visit: Payer: Self-pay

## 2021-08-26 DIAGNOSIS — N950 Postmenopausal bleeding: Secondary | ICD-10-CM | POA: Diagnosis not present

## 2021-08-26 DIAGNOSIS — Z87891 Personal history of nicotine dependence: Secondary | ICD-10-CM | POA: Insufficient documentation

## 2021-08-26 DIAGNOSIS — D250 Submucous leiomyoma of uterus: Secondary | ICD-10-CM | POA: Insufficient documentation

## 2021-08-26 DIAGNOSIS — E669 Obesity, unspecified: Secondary | ICD-10-CM | POA: Insufficient documentation

## 2021-08-26 DIAGNOSIS — N882 Stricture and stenosis of cervix uteri: Secondary | ICD-10-CM | POA: Diagnosis not present

## 2021-08-26 DIAGNOSIS — E785 Hyperlipidemia, unspecified: Secondary | ICD-10-CM | POA: Diagnosis not present

## 2021-08-26 DIAGNOSIS — R7303 Prediabetes: Secondary | ICD-10-CM | POA: Diagnosis not present

## 2021-08-26 DIAGNOSIS — I10 Essential (primary) hypertension: Secondary | ICD-10-CM | POA: Diagnosis not present

## 2021-08-26 DIAGNOSIS — Z20822 Contact with and (suspected) exposure to covid-19: Secondary | ICD-10-CM | POA: Diagnosis not present

## 2021-08-26 DIAGNOSIS — N8501 Benign endometrial hyperplasia: Secondary | ICD-10-CM | POA: Diagnosis not present

## 2021-08-26 DIAGNOSIS — R87611 Atypical squamous cells cannot exclude high grade squamous intraepithelial lesion on cytologic smear of cervix (ASC-H): Secondary | ICD-10-CM | POA: Diagnosis not present

## 2021-08-26 DIAGNOSIS — Z6834 Body mass index (BMI) 34.0-34.9, adult: Secondary | ICD-10-CM | POA: Insufficient documentation

## 2021-08-26 HISTORY — PX: DILATATION & CURETTAGE/HYSTEROSCOPY WITH MYOSURE: SHX6511

## 2021-08-26 LAB — ABO/RH: ABO/RH(D): A POS

## 2021-08-26 LAB — TYPE AND SCREEN
ABO/RH(D): A POS
Antibody Screen: NEGATIVE

## 2021-08-26 SURGERY — DILATATION & CURETTAGE/HYSTEROSCOPY WITH MYOSURE
Anesthesia: General | Site: Vagina

## 2021-08-26 MED ORDER — LIDOCAINE HCL (CARDIAC) PF 100 MG/5ML IV SOSY
PREFILLED_SYRINGE | INTRAVENOUS | Status: DC | PRN
  Administered 2021-08-26: 100 mg via INTRAVENOUS

## 2021-08-26 MED ORDER — MIDAZOLAM HCL 2 MG/2ML IJ SOLN
INTRAMUSCULAR | Status: AC
  Filled 2021-08-26: qty 2

## 2021-08-26 MED ORDER — AMISULPRIDE (ANTIEMETIC) 5 MG/2ML IV SOLN: 10.0000 mg | Freq: Once | INTRAVENOUS | Status: DC | PRN

## 2021-08-26 MED ORDER — FENTANYL CITRATE (PF) 100 MCG/2ML IJ SOLN
INTRAMUSCULAR | Status: DC | PRN
  Administered 2021-08-26: 25 ug via INTRAVENOUS
  Administered 2021-08-26: 50 ug via INTRAVENOUS
  Administered 2021-08-26: 25 ug via INTRAVENOUS

## 2021-08-26 MED ORDER — FENTANYL CITRATE (PF) 100 MCG/2ML IJ SOLN
INTRAMUSCULAR | Status: AC
  Filled 2021-08-26: qty 2

## 2021-08-26 MED ORDER — PROPOFOL 10 MG/ML IV BOLUS
INTRAVENOUS | Status: AC
  Filled 2021-08-26: qty 20

## 2021-08-26 MED ORDER — SODIUM CHLORIDE 0.9 % IR SOLN
Status: DC | PRN
  Administered 2021-08-26: 1500 mL

## 2021-08-26 MED ORDER — KETOROLAC TROMETHAMINE 30 MG/ML IJ SOLN
INTRAMUSCULAR | Status: AC
  Filled 2021-08-26: qty 1

## 2021-08-26 MED ORDER — KETOROLAC TROMETHAMINE 30 MG/ML IJ SOLN
INTRAMUSCULAR | Status: DC | PRN
  Administered 2021-08-26: 15 mg via INTRAVENOUS

## 2021-08-26 MED ORDER — DEXAMETHASONE SODIUM PHOSPHATE 10 MG/ML IJ SOLN
INTRAMUSCULAR | Status: AC
  Filled 2021-08-26: qty 1

## 2021-08-26 MED ORDER — OXYCODONE HCL 5 MG/5ML PO SOLN: 5.0000 mg | Freq: Once | ORAL | Status: DC | PRN

## 2021-08-26 MED ORDER — ACETAMINOPHEN 500 MG PO TABS
ORAL_TABLET | ORAL | Status: AC
  Filled 2021-08-26: qty 2

## 2021-08-26 MED ORDER — ONDANSETRON HCL 4 MG/2ML IJ SOLN: 4.0000 mg | Freq: Once | INTRAMUSCULAR | Status: DC | PRN

## 2021-08-26 MED ORDER — OXYCODONE HCL 5 MG PO TABS: 5.0000 mg | ORAL_TABLET | Freq: Once | ORAL | Status: DC | PRN

## 2021-08-26 MED ORDER — MIDAZOLAM HCL 5 MG/5ML IJ SOLN
INTRAMUSCULAR | Status: DC | PRN
  Administered 2021-08-26 (×2): 1 mg via INTRAVENOUS

## 2021-08-26 MED ORDER — PROPOFOL 10 MG/ML IV BOLUS
INTRAVENOUS | Status: DC | PRN
  Administered 2021-08-26: 200 mg via INTRAVENOUS

## 2021-08-26 MED ORDER — LACTATED RINGERS IV SOLN: INTRAVENOUS | Status: DC

## 2021-08-26 MED ORDER — ONDANSETRON HCL 4 MG/2ML IJ SOLN
INTRAMUSCULAR | Status: AC
  Filled 2021-08-26: qty 2

## 2021-08-26 MED ORDER — DEXAMETHASONE SODIUM PHOSPHATE 4 MG/ML IJ SOLN
INTRAMUSCULAR | Status: DC | PRN
  Administered 2021-08-26: 5 mg via INTRAVENOUS

## 2021-08-26 MED ORDER — FENTANYL CITRATE (PF) 100 MCG/2ML IJ SOLN: 25.0000 ug | INTRAMUSCULAR | Status: DC | PRN

## 2021-08-26 MED ORDER — ONDANSETRON HCL 4 MG/2ML IJ SOLN
INTRAMUSCULAR | Status: DC | PRN
  Administered 2021-08-26: 4 mg via INTRAVENOUS

## 2021-08-26 MED ORDER — ACETAMINOPHEN 500 MG PO TABS
1000.0000 mg | ORAL_TABLET | Freq: Once | ORAL | Status: AC
  Administered 2021-08-26: 1000 mg via ORAL

## 2021-08-26 MED ORDER — SILVER NITRATE-POT NITRATE 75-25 % EX MISC
CUTANEOUS | Status: DC | PRN
  Administered 2021-08-26: 3

## 2021-08-26 SURGICAL SUPPLY — 17 items
CATH ROBINSON RED A/P 16FR (CATHETERS) IMPLANT
DEVICE MYOSURE LITE (MISCELLANEOUS) IMPLANT
DEVICE MYOSURE REACH (MISCELLANEOUS) ×2 IMPLANT
DILATOR CANAL MILEX (MISCELLANEOUS) IMPLANT
DRSG TELFA 3X8 NADH (GAUZE/BANDAGES/DRESSINGS) ×2 IMPLANT
GAUZE 4X4 16PLY ~~LOC~~+RFID DBL (SPONGE) ×2 IMPLANT
GLOVE SURG ENC MOIS LTX SZ6.5 (GLOVE) ×2 IMPLANT
GLOVE SURG ENC TEXT LTX SZ6.5 (GLOVE) ×2 IMPLANT
GLOVE SURG UNDER POLY LF SZ7 (GLOVE) ×2 IMPLANT
GOWN STRL REUS W/TWL LRG LVL3 (GOWN DISPOSABLE) ×2 IMPLANT
KIT PROCEDURE FLUENT (KITS) ×2 IMPLANT
KIT TURNOVER CYSTO (KITS) ×2 IMPLANT
PACK VAGINAL MINOR WOMEN LF (CUSTOM PROCEDURE TRAY) ×2 IMPLANT
PAD OB MATERNITY 4.3X12.25 (PERSONAL CARE ITEMS) ×2 IMPLANT
SEAL ROD LENS SCOPE MYOSURE (ABLATOR) ×2 IMPLANT
TOWEL OR 17X26 10 PK STRL BLUE (TOWEL DISPOSABLE) ×2 IMPLANT
UNDERPAD 30X36 HEAVY ABSORB (UNDERPADS AND DIAPERS) IMPLANT

## 2021-08-26 NOTE — Anesthesia Postprocedure Evaluation (Signed)
Anesthesia Post Note  Patient: Katrina Evans  Procedure(s) Performed: DILATATION & CURETTAGE/HYSTEROSCOPY WITH MYOSURE (Vagina )     Patient location during evaluation: PACU Anesthesia Type: General Level of consciousness: awake Pain management: pain level controlled Vital Signs Assessment: post-procedure vital signs reviewed and stable Respiratory status: spontaneous breathing and respiratory function stable Cardiovascular status: stable Postop Assessment: no apparent nausea or vomiting Anesthetic complications: no   No notable events documented.  Last Vitals:  Vitals:   08/26/21 1400 08/26/21 1425  BP: 136/67 137/63  Pulse: 69 73  Resp: 10 14  Temp:  36.4 C  SpO2: 96% 97%    Last Pain:  Vitals:   08/26/21 1425  TempSrc:   PainSc: 3                  Candra R [name]

## 2021-08-26 NOTE — Anesthesia Procedure Notes (Signed)
Procedure Name: LMA Insertion Date/Time: 08/26/2021 12:45 PM Performed by: Bufford Spikes, CRNA Pre-anesthesia Checklist: Patient identified, Emergency Drugs available, Suction available and Patient being monitored Patient Re-evaluated:Patient Re-evaluated prior to induction Oxygen Delivery Method: Circle system utilized Preoxygenation: Pre-oxygenation with 100% oxygen Induction Type: IV induction Ventilation: Mask ventilation without difficulty LMA: LMA inserted LMA Size: 4.0 Number of attempts: 1 Placement Confirmation: positive ETCO2 Tube secured with: Tape Dental Injury: Teeth and Oropharynx as per pre-operative assessment

## 2021-08-26 NOTE — Op Note (Signed)
Pre Op Dx:   1. Postmenopausal bleeding 2. Suspected endometrial polyp on [place] 3. Submucosal leiomyoma 4. ASC-H Pap smear  Post Op Dx:   Same as pre-operative diagnoses  Procedure:   1. Hysteroscopy with Dilation and Curettage 2. Myosure polypectomy and submucosal fibroid resection 3. Endocervical curettings   Surgeon:  Dr. [name] Assistants:  None Anesthesia:  General   EBL:  20cc  IVF:  350cc UOP:  Voided prior to arrival to OR Fluid Deficit:  300cc   Drains:  None Specimen removed:  Endometrial curettings and portions of submucosal fibroid - sent to pathology  Endocervical curettings - sent to pathology Device(s) implanted: None Case Type:  Clean-contaminated Findings:  Normal-appearing, atrophic cervix. Cervical stenosis. Endometrium with multiple broad-based flat polypoid tissue and submucosal fibroid within endometrial cavity at the fundus. Bilateral tubal ostia visualized. No lesions visualized within the cervical canal. Complications: None  Indications:  70 y.o. postmenopausal G3P3 with persistent postmenopausal bleeding with suspected endometrial polyp on [place]. She also had an ASC-H pap smear - colposcopic ectocervical biopsy benign. ECC benign but sample was minute.  Description of each procedure:  After informed consent was obtained the patient was taken to the operating room in the dorsal supine position.  After administration of general anesthesia, the patient was placed in the dorsal lithotomy position and prepped and draped in the usual sterile fashion. A pre-operative time-out was completed.  The anterior lip of the cervix was grasped with a single-tooth tenaculum and the cervix was serially dilated to accommodate the hysteroscope.  The hysteroscope was advanced and the findings as above was noted.  The Myosure Reach was used to resect the polypoid tissue and submucosal fibroid. A sharp banjo curette was used to curettage the cervical canal and then the  endometrium. The single-tooth tenaculum was removed and its sites were made hemostatic with silver nitrate.  Adequate hemostasis was noted.  The patient was awakened and extubated and appeared to have tolerated the procedure well.  All counts were correct. Disposition:  PACU  [name], DO

## 2021-08-26 NOTE — Transfer of Care (Signed)
Immediate Anesthesia Transfer of Care Note  Patient: Katrina Evans  Procedure(s) Performed: DILATATION & CURETTAGE/HYSTEROSCOPY WITH MYOSURE (Vagina )  Patient Location: PACU  Anesthesia Type:General  Level of Consciousness: awake, alert  and oriented  Airway & Oxygen Therapy: Patient Spontanous Breathing and Patient connected to nasal cannula oxygen  Post-op Assessment: Report given to RN and Post -op Vital signs reviewed and stable  Post vital signs: Reviewed and stable  Last Vitals:  Vitals Value Taken Time  BP    Temp    Pulse 87 08/26/21 1329  Resp 12 08/26/21 1329  SpO2 98 % 08/26/21 1329  Vitals shown include unvalidated device data.  Last Pain:  Vitals:   08/26/21 1117  TempSrc: Oral  PainSc: 0-No pain      Patients Stated Pain Goal: 5 (35/67/01 4103)  Complications: No notable events documented.

## 2021-08-26 NOTE — Anesthesia Preprocedure Evaluation (Addendum)
Anesthesia Evaluation  Patient identified by MRN, date of birth, ID band Patient awake    Reviewed: Allergy & Precautions, NPO status , Patient's Chart, lab work & pertinent test results  Airway Mallampati: II  TM Distance: >3 FB Neck ROM: Full    Dental no notable dental hx.    Pulmonary former smoker,    Pulmonary exam normal breath sounds clear to auscultation       Cardiovascular hypertension, Normal cardiovascular exam Rhythm:Regular Rate:Normal     Neuro/Psych negative neurological ROS  negative psych ROS   GI/Hepatic negative GI ROS, Neg liver ROS,   Endo/Other  Obesity Hyperlipidemia Pre-diabetes  Renal/GU negative Renal ROS     Musculoskeletal negative musculoskeletal ROS (+)   Abdominal   Peds negative pediatric ROS (+)  Hematology negative hematology ROS (+)   Anesthesia Other Findings   Reproductive/Obstetrics negative OB ROS                             Anesthesia Physical Anesthesia Plan  ASA: 2  Anesthesia Plan: General   Post-op Pain Management:    Induction: Intravenous  PONV Risk Score and Plan: 3 and Treatment may vary due to age or medical condition, Midazolam and Ondansetron  Airway Management Planned: LMA  Additional Equipment:   Intra-op Plan:   Post-operative Plan: Extubation in OR  Informed Consent: I have reviewed the patients History and Physical, chart, labs and discussed the procedure including the risks, benefits and alternatives for the proposed anesthesia with the patient or authorized representative who has indicated his/her understanding and acceptance.     Dental advisory given  Plan Discussed with: CRNA and Anesthesiologist  Anesthesia Plan Comments:         Anesthesia Quick Evaluation

## 2021-08-26 NOTE — Discharge Instructions (Signed)
  Post Anesthesia Home Care Instructions  Activity: Get plenty of rest for the remainder of the day. A responsible individual must stay with you for 24 hours following the procedure.  For the next 24 hours, DO NOT: -Drive a car -Paediatric nurse -Drink alcoholic beverages -Take any medication unless instructed by your physician -Make any legal decisions or sign important papers.  Meals: Start with liquid foods such as gelatin or soup. Progress to regular foods as tolerated. Avoid greasy, spicy, heavy foods. If nausea and/or vomiting occur, drink only clear liquids until the nausea and/or vomiting subsides. Call your physician if vomiting continues.  Special Instructions/Symptoms: Your throat may feel dry or sore from the anesthesia or the breathing tube placed in your throat during surgery. If this causes discomfort, gargle with warm salt water. The discomfort should disappear within 24 hours.  No ibuprofen, Advil, Aleve, Motrin, ketorolac, meloxicam, naproxen, or other NSAIDS until after 7:15 pm today if needed. No Tylenol (acetaminophen) until after 5:30 pm today if needed.

## 2021-08-26 NOTE — Interval H&P Note (Signed)
History and Physical Interval Note:  08/26/2021 12:29 PM  Katrina Evans  has presented today for surgery, with the diagnosis of Post Menopausal Bleeding.  The various methods of treatment have been discussed with the patient and family. After consideration of risks, benefits and other options for treatment, the patient has consented to  Procedure(s): Scissors (N/A) as a surgical intervention. We discussed performing an Edocervical Curettage (due to previous scant fragments on colposcopy) for ASC-H pap smear.  The patient's history has been reviewed, patient examined, no change in status, stable for surgery.  I have reviewed the patient's chart and labs.  Questions were answered to the patient's satisfaction.     [name]

## 2021-08-27 ENCOUNTER — Encounter (HOSPITAL_BASED_OUTPATIENT_CLINIC_OR_DEPARTMENT_OTHER): Payer: Self-pay | Admitting: Obstetrics and Gynecology

## 2021-08-27 LAB — CYTOLOGY - NON PAP

## 2021-08-27 LAB — SURGICAL PATHOLOGY

## 2021-09-01 ENCOUNTER — Encounter (INDEPENDENT_AMBULATORY_CARE_PROVIDER_SITE_OTHER): Payer: Self-pay | Admitting: Physician Assistant

## 2021-09-01 ENCOUNTER — Other Ambulatory Visit: Payer: Self-pay

## 2021-09-01 ENCOUNTER — Ambulatory Visit (INDEPENDENT_AMBULATORY_CARE_PROVIDER_SITE_OTHER): Payer: Medicare HMO | Admitting: Physician Assistant

## 2021-09-01 VITALS — BP 128/58 | HR 70 | Temp 97.8°F | Ht 63.0 in | Wt 189.0 lb

## 2021-09-01 DIAGNOSIS — E6609 Other obesity due to excess calories: Secondary | ICD-10-CM | POA: Diagnosis not present

## 2021-09-01 DIAGNOSIS — R7303 Prediabetes: Secondary | ICD-10-CM | POA: Diagnosis not present

## 2021-09-01 DIAGNOSIS — Z6834 Body mass index (BMI) 34.0-34.9, adult: Secondary | ICD-10-CM

## 2021-09-01 NOTE — Progress Notes (Signed)
Chief Complaint:   OBESITY Katrina Evans is here to discuss her progress with her obesity treatment plan along with follow-up of her obesity related diagnoses. Katrina Evans is on the Category 2 Plan and keeping a food journal and adhering to recommended goals of 400-500 calories and 35+ grams protein with dinner and 600-700 snack calories and states she is following her eating plan approximately 20% of the time. Katrina Evans states she is not currently exercising.  Today's visit was #: 6 Starting weight: 189 lbs Starting date: 04/29/2021 Today's weight: 189 lbs Today's date: 09/01/2021 Total lbs lost to date: 0 Total lbs lost since last in-office visit: 1  Interim History: Katrina Evans is not able to exercise as much as she would like due to a busy schedule at home and work. She is not weighing her protein. Reorts eating an omelette or yogurt or Special K protein for breakfast and chili or soup for lunch. She eats out for dinner.   Subjective:   1. Pre-diabetes Pt's last A1c was 5.9 and she is not on medication.  Assessment/Plan:   1. Pre-diabetes Katrina Evans will continue to work on weight loss, exercise, and decreasing simple carbohydrates to help decrease the risk of diabetes. Continue with plan and increase activity.  2. Obesity, current BMI 33.49  Katrina Evans is currently in the action stage of change. As such, her goal is to continue with weight loss efforts. She has agreed to the Category 2 Plan.   Exercise goals:  As is  Behavioral modification strategies: increasing lean protein intake and decreasing eating out.  Katrina Evans has agreed to follow-up with our clinic in 3 weeks. She was informed of the importance of frequent follow-up visits to maximize her success with intensive lifestyle modifications for her multiple health conditions.   Objective:   Blood pressure (!) 128/58, pulse 70, temperature 97.8 F (36.6 C), height 5\' 3"  (1.6 m), weight 189 lb (85.7 kg), SpO2 97 %. Body mass  index is 33.48 kg/m.  General: Cooperative, alert, well developed, in no acute distress. HEENT: Conjunctivae and lids unremarkable. Cardiovascular: Regular rhythm.  Lungs: Normal work of breathing. Neurologic: No focal deficits.   Lab Results  Component Value Date   CREATININE 0.61 08/23/2021   BUN 23 08/23/2021   NA 138 08/23/2021   K 3.6 08/23/2021   CL 104 08/23/2021   CO2 28 08/23/2021   Lab Results  Component Value Date   ALT 22 06/29/2021   AST 17 06/29/2021   ALKPHOS 57 06/29/2021   Lab Results  Component Value Date   HGBA1C 5.9 06/29/2021   HGBA1C 6.0 (H) 04/29/2021   Lab Results  Component Value Date   INSULIN 24.2 04/29/2021   Lab Results  Component Value Date   TSH 2.580 04/29/2021   Lab Results  Component Value Date   CHOL 182 06/29/2021   HDL 65 06/29/2021   LDLCALC 101 06/29/2021   TRIG 87 06/29/2021   Lab Results  Component Value Date   VD25OH 29 06/29/2021   VD25OH 50.8 04/29/2021   Lab Results  Component Value Date   WBC 4.4 08/23/2021   HGB 14.0 08/23/2021   HCT 42.6 08/23/2021   MCV 97.5 08/23/2021   PLT 164 08/23/2021   No results found for: IRON, TIBC, FERRITIN  Obesity Behavioral Intervention:   Approximately 15 minutes were spent on the discussion below.  ASK: We discussed the diagnosis of obesity with Katrina Evans today and Katrina Evans agreed to give [place] permission to discuss obesity behavioral modification  therapy today.  ASSESS: Katrina Evans has the diagnosis of obesity and her BMI today is 33.6. Katrina Evans is in the action stage of change.   ADVISE: Katrina Evans was educated on the multiple health risks of obesity as well as the benefit of weight loss to improve her health. She was advised of the need for long term treatment and the importance of lifestyle modifications to improve her current health and to decrease her risk of future health problems.  AGREE: Multiple dietary modification options and treatment options were  discussed and Katrina Evans agreed to follow the recommendations documented in the above note.  ARRANGE: Katrina Evans was educated on the importance of frequent visits to treat obesity as outlined per CMS and USPSTF guidelines and agreed to schedule her next follow up appointment today.  Attestation Statements:   Reviewed by clinician on day of visit: allergies, medications, problem list, medical history, surgical history, family history, social history, and previous encounter notes.  Coral Ceo, CMA, am acting as transcriptionist for [employer], PA-C.  I have reviewed the above documentation for accuracy and completeness, and I agree with the above. Abby Potash, PA-C

## 2021-09-13 DIAGNOSIS — N950 Postmenopausal bleeding: Secondary | ICD-10-CM | POA: Diagnosis not present

## 2021-09-13 DIAGNOSIS — N8500 Endometrial hyperplasia, unspecified: Secondary | ICD-10-CM | POA: Diagnosis not present

## 2021-09-13 DIAGNOSIS — D250 Submucous leiomyoma of uterus: Secondary | ICD-10-CM | POA: Diagnosis not present

## 2021-09-13 DIAGNOSIS — R87611 Atypical squamous cells cannot exclude high grade squamous intraepithelial lesion on cytologic smear of cervix (ASC-H): Secondary | ICD-10-CM | POA: Diagnosis not present

## 2021-09-22 ENCOUNTER — Ambulatory Visit (INDEPENDENT_AMBULATORY_CARE_PROVIDER_SITE_OTHER): Payer: Medicare HMO | Admitting: Bariatrics

## 2021-09-22 ENCOUNTER — Other Ambulatory Visit: Payer: Self-pay

## 2021-09-22 ENCOUNTER — Encounter (INDEPENDENT_AMBULATORY_CARE_PROVIDER_SITE_OTHER): Payer: Self-pay | Admitting: Bariatrics

## 2021-09-22 VITALS — BP 145/78 | HR 79 | Temp 98.0°F | Ht 63.0 in | Wt 191.0 lb

## 2021-09-22 DIAGNOSIS — E6609 Other obesity due to excess calories: Secondary | ICD-10-CM

## 2021-09-22 DIAGNOSIS — E559 Vitamin D deficiency, unspecified: Secondary | ICD-10-CM | POA: Diagnosis not present

## 2021-09-22 DIAGNOSIS — R7303 Prediabetes: Secondary | ICD-10-CM | POA: Diagnosis not present

## 2021-09-22 DIAGNOSIS — E7849 Other hyperlipidemia: Secondary | ICD-10-CM | POA: Diagnosis not present

## 2021-09-22 DIAGNOSIS — Z6834 Body mass index (BMI) 34.0-34.9, adult: Secondary | ICD-10-CM | POA: Diagnosis not present

## 2021-09-22 DIAGNOSIS — I10 Essential (primary) hypertension: Secondary | ICD-10-CM

## 2021-09-22 NOTE — Progress Notes (Signed)
Chief Complaint:   OBESITY Katrina Evans is here to discuss her progress with her obesity treatment plan along with follow-up of her obesity related diagnoses. Diania is on the Category 2 Plan and states she is following her eating plan approximately 15% of the time. Katrina Evans states she is doing 0 minutes 0 times per week.  Today's visit was #: 7 Starting weight: 189 lbs Starting date: 04/29/2021 Today's weight: 191 lbs Today's date: 09/22/2021 Total lbs lost to date: 0 Total lbs lost since last in-office visit: 0  Interim History: Katrina Evans is up 2 lbs since her last visit. She is up about 4 lbs of water per the bioimpedance test.  Subjective:   1. Essential hypertension Katrina Evans is currently taking HCTZ.  2. Other hyperlipidemia Katrina Evans is taking Lipitor currently.  3. Prediabetes Katrina Evans's last A1C level was 5.9.   4. Vitamin D deficiency Katrina Evans is taking high dose Vitamin D. Her last Vitamin D level was 29.0.  Assessment/Plan:   1. Essential hypertension Katrina Evans will continue her medications. She is working on healthy weight loss and exercise to improve blood pressure control. We will watch for signs of hypotension as she continues her lifestyle modifications.  2. Other hyperlipidemia Cardiovascular risk and specific lipid/LDL goals reviewed.  We discussed several lifestyle modifications today and Katrina Evans will continue to work on diet, exercise and weight loss efforts. Katrina Evans will continue Lipitor. Orders and follow up as documented in patient record.   Counseling Intensive lifestyle modifications are the first line treatment for this issue. Dietary changes: Increase soluble fiber. Decrease simple carbohydrates. Exercise changes: Moderate to vigorous-intensity aerobic activity 150 minutes per week if tolerated. Lipid-lowering medications: see documented in medical record.   3. Prediabetes Katrina Evans will continue to work on weight loss, exercise,  and decreasing simple carbohydrates to help decrease the risk of diabetes. She will increase healthy fats and protein.   4. Vitamin D deficiency Low Vitamin D level contributes to fatigue and are associated with obesity, breast, and colon cancer. Katrina Evans will continue prescription Vitamin D 50,000 IU every week and she will follow-up for routine testing of Vitamin D, at least 2-3 times per year to avoid over-replacement.  5. Obesity, current BMI 33.9 Katrina Evans is currently in the action stage of change. As such, her goal is to continue with weight loss efforts. She has agreed to the Category 2 Plan.   Katrina Evans will continue meal planning. She will adhere closely to the plan 80-90%. We reviewed labs from 06/29/2021 A1C, Lipid panel, Vitamin D and CMP.   Exercise goals:  Katrina Evans will start to walk some on the treadmill.  Behavioral modification strategies: increasing lean protein intake, decreasing simple carbohydrates, increasing vegetables, increasing water intake, decreasing eating out, no skipping meals, meal planning and cooking strategies, keeping healthy foods in the home, and planning for success.  Katrina Evans has agreed to follow-up with our clinic in 4 weeks with Mina Marble, NP or Abby Potash, PA-C. She was informed of the importance of frequent follow-up visits to maximize her success with intensive lifestyle modifications for her multiple health conditions.   Objective:   Blood pressure (!) 145/78, pulse 79, temperature 98 F (36.7 C), height 5\' 3"  (1.6 m), weight 191 lb (86.6 kg), SpO2 98 %. Body mass index is 33.83 kg/m.  General: Cooperative, alert, well developed, in no acute distress. HEENT: Conjunctivae and lids unremarkable. Cardiovascular: Regular rhythm.  Lungs: Normal work of breathing. Neurologic: No focal deficits.   Lab Results  Component Value  Date   CREATININE 0.61 08/23/2021   BUN 23 08/23/2021   NA 138 08/23/2021   K 3.6 08/23/2021   CL 104  08/23/2021   CO2 28 08/23/2021   Lab Results  Component Value Date   ALT 22 06/29/2021   AST 17 06/29/2021   ALKPHOS 57 06/29/2021   Lab Results  Component Value Date   HGBA1C 5.9 06/29/2021   HGBA1C 6.0 (H) 04/29/2021   Lab Results  Component Value Date   INSULIN 24.2 04/29/2021   Lab Results  Component Value Date   TSH 2.580 04/29/2021   Lab Results  Component Value Date   CHOL 182 06/29/2021   HDL 65 06/29/2021   LDLCALC 101 06/29/2021   TRIG 87 06/29/2021   Lab Results  Component Value Date   VD25OH 29 06/29/2021   VD25OH 50.8 04/29/2021   Lab Results  Component Value Date   WBC 4.4 08/23/2021   HGB 14.0 08/23/2021   HCT 42.6 08/23/2021   MCV 97.5 08/23/2021   PLT 164 08/23/2021   No results found for: IRON, TIBC, FERRITIN  Attestation Statements:   Reviewed by clinician on day of visit: allergies, medications, problem list, medical history, surgical history, family history, social history, and previous encounter notes.  I, Lizbeth Bark, RMA, am acting as Location manager for [employer], DO.   I have reviewed the above documentation for accuracy and completeness, and I agree with the above. [name], DO

## 2021-10-01 ENCOUNTER — Encounter: Payer: Medicare HMO | Attending: Physician Assistant | Admitting: Registered"

## 2021-10-01 DIAGNOSIS — R7303 Prediabetes: Secondary | ICD-10-CM | POA: Insufficient documentation

## 2021-10-08 ENCOUNTER — Encounter: Payer: Self-pay | Admitting: Registered"

## 2021-10-08 NOTE — Progress Notes (Signed)
On 10/01/21 patient completed a post core session of the Diabetes Prevention Program course virtually with [facility].   Virtual Visit via Video Note  I connected with Katrina Evans by a video enabled application and verified that I am speaking with the correct person using two identifiers.  Location: Patient: Home.  Provider: Office.   By the end of this session patients are able to complete the following objectives:   Learning Objectives: Describe the relationship between nutrition and bone health.  List ways to prevent fractures and falls.  Goals:  Record weight taken outside of class.  Track foods and beverages eaten each day in the "Food and Activity Tracker," including calories and fat grams for each item.   Track activity type, minutes you were active, and distance you reached each day in the "Food and Activity Tracker."   Follow-Up Plan: Attend session next monthly session.  Bring completed "Food and Activity Trackers" to next session to be reviewed by Lifestyle Coach.

## 2021-10-12 ENCOUNTER — Encounter (INDEPENDENT_AMBULATORY_CARE_PROVIDER_SITE_OTHER): Payer: Self-pay | Admitting: Bariatrics

## 2021-10-27 ENCOUNTER — Ambulatory Visit (INDEPENDENT_AMBULATORY_CARE_PROVIDER_SITE_OTHER): Payer: Medicare HMO | Admitting: Adult Health

## 2021-10-27 DIAGNOSIS — M1990 Unspecified osteoarthritis, unspecified site: Secondary | ICD-10-CM | POA: Diagnosis not present

## 2021-10-27 DIAGNOSIS — N8500 Endometrial hyperplasia, unspecified: Secondary | ICD-10-CM | POA: Diagnosis not present

## 2021-11-01 DIAGNOSIS — N8501 Benign endometrial hyperplasia: Secondary | ICD-10-CM | POA: Diagnosis not present

## 2021-11-01 DIAGNOSIS — N950 Postmenopausal bleeding: Secondary | ICD-10-CM | POA: Diagnosis not present

## 2021-11-04 ENCOUNTER — Other Ambulatory Visit (INDEPENDENT_AMBULATORY_CARE_PROVIDER_SITE_OTHER): Payer: Self-pay

## 2021-11-05 ENCOUNTER — Encounter: Payer: Medicare HMO | Attending: Physician Assistant | Admitting: Registered"

## 2021-11-05 DIAGNOSIS — R7303 Prediabetes: Secondary | ICD-10-CM | POA: Diagnosis not present

## 2021-11-12 ENCOUNTER — Encounter: Payer: Self-pay | Admitting: Registered"

## 2021-11-12 NOTE — Progress Notes (Signed)
On 11/05/21 patient completed a post core session of the Diabetes Prevention Program course virtually with [facility]. By the end of this session patients are able to complete the following objectives:   Virtual Visit via Video Note  I connected with Katrina Evans by a video enabled application and verified that I am speaking with the correct person using two identifiers.  Location: Patient: Home.  Provider: Office.   Learning Objectives: Counter self-defeating thoughts with positive self-statements Define assertiveness.  List examples of ways to practice assertiveness.   Goals:  Record weight taken outside of class.  Track foods and beverages eaten each day in the "Food and Activity Tracker," including calories and fat grams for each item.   Track activity type, minutes you were active, and distance you reached each day in the "Food and Activity Tracker."   Follow-Up Plan: Attend next session.  Email completed "Food and Activity Trackers" before next session to be reviewed by Lifestyle Coach.

## 2021-11-15 ENCOUNTER — Other Ambulatory Visit: Payer: Self-pay

## 2021-11-15 ENCOUNTER — Ambulatory Visit (HOSPITAL_BASED_OUTPATIENT_CLINIC_OR_DEPARTMENT_OTHER): Payer: Medicare HMO | Admitting: Orthopaedic Surgery

## 2021-11-15 ENCOUNTER — Ambulatory Visit (HOSPITAL_BASED_OUTPATIENT_CLINIC_OR_DEPARTMENT_OTHER)
Admission: RE | Admit: 2021-11-15 | Discharge: 2021-11-15 | Disposition: A | Discharge status: Home / Self Care | Payer: Medicare HMO | Source: Ambulatory Visit | Attending: Orthopaedic Surgery | Admitting: Orthopaedic Surgery

## 2021-11-15 ENCOUNTER — Other Ambulatory Visit (HOSPITAL_BASED_OUTPATIENT_CLINIC_OR_DEPARTMENT_OTHER): Payer: Self-pay | Admitting: Orthopaedic Surgery

## 2021-11-15 DIAGNOSIS — M174 Other bilateral secondary osteoarthritis of knee: Secondary | ICD-10-CM

## 2021-11-15 DIAGNOSIS — M25562 Pain in left knee: Secondary | ICD-10-CM | POA: Insufficient documentation

## 2021-11-15 DIAGNOSIS — H5203 Hypermetropia, bilateral: Secondary | ICD-10-CM | POA: Diagnosis not present

## 2021-11-15 DIAGNOSIS — M1711 Unilateral primary osteoarthritis, right knee: Secondary | ICD-10-CM | POA: Diagnosis not present

## 2021-11-15 DIAGNOSIS — M25561 Pain in right knee: Secondary | ICD-10-CM

## 2021-11-15 DIAGNOSIS — H2513 Age-related nuclear cataract, bilateral: Secondary | ICD-10-CM | POA: Diagnosis not present

## 2021-11-15 DIAGNOSIS — M1712 Unilateral primary osteoarthritis, left knee: Secondary | ICD-10-CM | POA: Diagnosis not present

## 2021-11-15 DIAGNOSIS — H04123 Dry eye syndrome of bilateral lacrimal glands: Secondary | ICD-10-CM | POA: Diagnosis not present

## 2021-11-15 NOTE — Progress Notes (Signed)
Chief Complaint: Lower back and right knee pain     History of Present Illness:    Katrina Evans is a 71 y.o. female presents with lower back pain going on for 1 month as well as right knee pain which has been very severe for several years although has progressed in the most recent weeks.  She states that she feels pain with going up and down stairs.  She does experience popping and crepitus.  She is taking ibuprofen which helps for the right knee.  She has previously had a series of steroid injections up to 3 times the previous year which more recently only gave a couple weeks of relief.  She has had the gel shot injections previously with minimal relief from these.  She continues to have pain in the patellofemoral area particular with stairs    Surgical History:   None  PMH/PSH/Family History/Social History/Meds/Allergies:    Past Medical History:  Diagnosis Date   Edema of both lower extremities    High cholesterol    Hypertension    Joint pain    Obesity    Pre-diabetes    SOB (shortness of breath)    Vitamin D deficiency    Past Surgical History:  Procedure Laterality Date   BREAST BIOPSY Right    No Scar   DILATATION & CURETTAGE/HYSTEROSCOPY WITH MYOSURE N/A 08/26/2021   Procedure: DILATATION & CURETTAGE/HYSTEROSCOPY WITH MYOSURE;  Surgeon: [name], DO;  Location: [place];  Service: Gynecology;  Laterality: N/A;   LAPAROSCOPIC APPENDECTOMY N/A 08/17/2017   Procedure: APPENDECTOMY LAPAROSCOPIC;  Surgeon: Excell Seltzer, MD;  Location: [facility];  Service: General;  Laterality: N/A;   laser vein surgery     Social History   Socioeconomic History   Marital status: Divorced    Spouse name: Not on file   Number of children: Not on file   Years of education: Not on file   Highest education level: Not on file  Occupational History   Occupation: Freight forwarder of Office Building  Tobacco Use   Smoking status:  Former    Packs/day: 1.00    Years: 35.00    Pack years: 35.00    Types: Cigarettes    Quit date: 2005    Years since quitting: 18.0   Smokeless tobacco: Never  Vaping Use   Vaping Use: Never used  Substance and Sexual Activity   Alcohol use: Yes    Comment: once yearly   Drug use: No   Sexual activity: Not Currently    Birth control/protection: Surgical  Other Topics Concern   Not on file  Social History Narrative   Not on file   Social Determinants of Health   Financial Resource Strain: Not on file  Food Insecurity: Not on file  Transportation Needs: Not on file  Physical Activity: Not on file  Stress: Not on file  Social Connections: Not on file   Family History  Problem Relation Age of Onset   Diabetes Mother    High blood pressure Mother    High Cholesterol Mother    Stroke Mother    Cancer Mother    Depression Mother    Schizophrenia Mother    Obesity Mother    High blood pressure Father    High Cholesterol Father    Heart  disease Father    Cancer Father    Alcoholism Father    Breast cancer Neg Hx    Allergies  Allergen Reactions   Diclofenac Nausea Only    Pt stated, "Upset my stomach"   Current Outpatient Medications  Medication Sig Dispense Refill   acetaminophen (TYLENOL) 500 MG tablet Take 1-2 tablets (500-1,000 mg total) by mouth every 6 (six) hours as needed for mild pain, moderate pain, fever or headache. 30 tablet 0   atorvastatin (LIPITOR) 10 MG tablet Take 10 mg by mouth daily.     Biotin w/ Vitamins C & E (HAIR/SKIN/NAILS PO) Take 6,000 mcg by mouth.     ergocalciferol (VITAMIN D2) 1.25 MG (50000 UT) capsule Take 50,000 Units by mouth once a week.     hydrochlorothiazide (HYDRODIURIL) 25 MG tablet TK 1 T PO QD IN THE MORNING FOR HIGH BP     No current facility-administered medications for this visit.   No results found.  Review of Systems:   A ROS was performed including pertinent positives and negatives as documented in the  HPI.  Physical Exam :   Constitutional: NAD and appears stated age Neurological: Alert and oriented Psych: Appropriate affect and cooperative There were no vitals taken for this visit.   Comprehensive Musculoskeletal Exam:    There is bilateral patellofemoral crepitus.  Range of motion right knee is from 0 to 130 degrees with some pain.  No joint line tenderness.  Negative Lachman.  Negative McMurray bilaterally.  Sensation is intact in all descriptions of the right knee.  There is moderate effusion of the right knee  Imaging:   Xray (4 views right knee, 4 views left knee): Bilateral tricompartmental moderate osteoarthritis most significantly involving the patellofemoral joint    I personally reviewed and interpreted the radiographs.   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  At this time she is trialed multiple injections which are no longer effective.  We did discuss the role of PRP as being the last possible injection.  She would like to defer this.  I did discuss for her the role of bracing versus physical therapy for strengthening of her hip and core/quad as her symptoms are predominantly patellofemoral.  She would like to start with this.  We did discuss that I would see her back prior to her next trip/cruise and we will plan for an injection prior to that in order to optimize her injection.  Plan :    -Return to clinic prior to her next cruise     I personally saw and evaluated the patient, and participated in the management and treatment plan.  Vanetta Mulders, MD Attending Physician, Orthopedic Surgery  This document was dictated using Dragon voice recognition software. A reasonable attempt at proof reading has been made to minimize errors.

## 2021-11-23 DIAGNOSIS — H2513 Age-related nuclear cataract, bilateral: Secondary | ICD-10-CM | POA: Diagnosis not present

## 2021-12-01 ENCOUNTER — Ambulatory Visit (HOSPITAL_BASED_OUTPATIENT_CLINIC_OR_DEPARTMENT_OTHER): Payer: Medicare HMO | Admitting: Physical Therapy

## 2021-12-03 ENCOUNTER — Encounter: Payer: Medicare HMO | Attending: Physician Assistant | Admitting: Registered"

## 2021-12-03 DIAGNOSIS — R7303 Prediabetes: Secondary | ICD-10-CM | POA: Diagnosis not present

## 2021-12-30 DIAGNOSIS — Z85820 Personal history of malignant melanoma of skin: Secondary | ICD-10-CM | POA: Diagnosis not present

## 2021-12-30 DIAGNOSIS — D1801 Hemangioma of skin and subcutaneous tissue: Secondary | ICD-10-CM | POA: Diagnosis not present

## 2021-12-30 DIAGNOSIS — D225 Melanocytic nevi of trunk: Secondary | ICD-10-CM | POA: Diagnosis not present

## 2021-12-30 DIAGNOSIS — L821 Other seborrheic keratosis: Secondary | ICD-10-CM | POA: Diagnosis not present

## 2021-12-30 DIAGNOSIS — D2239 Melanocytic nevi of other parts of face: Secondary | ICD-10-CM | POA: Diagnosis not present

## 2022-01-24 ENCOUNTER — Ambulatory Visit (INDEPENDENT_AMBULATORY_CARE_PROVIDER_SITE_OTHER): Payer: Medicare HMO | Admitting: Orthopaedic Surgery

## 2022-01-24 DIAGNOSIS — M5459 Other low back pain: Secondary | ICD-10-CM | POA: Diagnosis not present

## 2022-01-24 DIAGNOSIS — M25562 Pain in left knee: Secondary | ICD-10-CM | POA: Diagnosis not present

## 2022-01-24 DIAGNOSIS — M25561 Pain in right knee: Secondary | ICD-10-CM

## 2022-01-24 MED ORDER — TRIAMCINOLONE ACETONIDE 40 MG/ML IJ SUSP
80.0000 mg | INTRAMUSCULAR | Status: AC | PRN
  Administered 2022-01-24: 80 mg via INTRA_ARTICULAR

## 2022-01-24 MED ORDER — LIDOCAINE HCL 1 % IJ SOLN
4.0000 mL | INTRAMUSCULAR | Status: AC | PRN
  Administered 2022-01-24: 4 mL

## 2022-01-24 NOTE — Progress Notes (Signed)
? ?                            ? ? ?Chief Complaint: Lower back and right knee pain ?  ? ? ?History of Present Illness:  ? ?01/24/2022: Presents today for follow-up of the right knee.  She is excited about going on her trip to the [place].  She is here today for right knee injection.  She continues to experience intermittent pain with the right knee which is very disabling from days.  This is been waking her up at night. ? ? ?Katrina Evans is a 71 y.o. female presents with lower back pain going on for 1 month as well as right knee pain which has been very severe for several years although has progressed in the most recent weeks.  She states that she feels pain with going up and down stairs.  She does experience popping and crepitus.  She is taking ibuprofen which helps for the right knee.  She has previously had a series of steroid injections up to 3 times the previous year which more recently only gave a couple weeks of relief.  She has had the gel shot injections previously with minimal relief from these.  She continues to have pain in the patellofemoral area particular with stairs ? ? ? ?Surgical History:   ?None ? ?PMH/PSH/Family History/Social History/Meds/Allergies:   ? ?Past Medical History:  ?Diagnosis Date  ?? Edema of both lower extremities   ?? High cholesterol   ?? Hypertension   ?? Joint pain   ?? Obesity   ?? Pre-diabetes   ?? SOB (shortness of breath)   ?? Vitamin D deficiency   ? ?Past Surgical History:  ?Procedure Laterality Date  ?? BREAST BIOPSY Right   ? No Scar  ?? DILATATION & CURETTAGE/HYSTEROSCOPY WITH MYOSURE N/A 08/26/2021  ? Procedure: DILATATION & CURETTAGE/HYSTEROSCOPY WITH MYOSURE;  Surgeon: [name], DO;  Location: [facility];  Service: Gynecology;  Laterality: N/A;  ?? LAPAROSCOPIC APPENDECTOMY N/A 08/17/2017  ? Procedure: APPENDECTOMY LAPAROSCOPIC;  Surgeon: Excell Seltzer, MD;  Location: [facility];  Service: General;  Laterality: N/A;  ?? laser vein surgery     ? ?Social History  ? ?Socioeconomic History  ?? Marital status: Divorced  ?  Spouse name: Not on file  ?? Number of children: Not on file  ?? Years of education: Not on file  ?? Highest education level: Not on file  ?Occupational History  ?? Occupation: Product manager  ?Tobacco Use  ?? Smoking status: Former  ?  Packs/day: 1.00  ?  Years: 35.00  ?  Pack years: 35.00  ?  Types: Cigarettes  ?  Quit date: 2005  ?  Years since quitting: 18.2  ?? Smokeless tobacco: Never  ?Vaping Use  ?? Vaping Use: Never used  ?Substance and Sexual Activity  ?? Alcohol use: Yes  ?  Comment: once yearly  ?? Drug use: No  ?? Sexual activity: Not Currently  ?  Birth control/protection: Surgical  ?Other Topics Concern  ?? Not on file  ?Social History Narrative  ?? Not on file  ? ?Social Determinants of Health  ? ?Financial Resource Strain: Not on file  ?Food Insecurity: Not on file  ?Transportation Needs: Not on file  ?Physical Activity: Not on file  ?Stress: Not on file  ?Social Connections: Not on file  ? ?Family History  ?Problem Relation Age of Onset  ??  Diabetes Mother   ?? High blood pressure Mother   ?? High Cholesterol Mother   ?? Stroke Mother   ?? Cancer Mother   ?? Depression Mother   ?? Schizophrenia Mother   ?? Obesity Mother   ?? High blood pressure Father   ?? High Cholesterol Father   ?? Heart disease Father   ?? Cancer Father   ?? Alcoholism Father   ?? Breast cancer Neg Hx   ? ?Allergies  ?Allergen Reactions  ?? Diclofenac Nausea Only  ?  Pt stated, "Upset my stomach"  ? ?Current Outpatient Medications  ?Medication Sig Dispense Refill  ?? acetaminophen (TYLENOL) 500 MG tablet Take 1-2 tablets (500-1,000 mg total) by mouth every 6 (six) hours as needed for mild pain, moderate pain, fever or headache. 30 tablet 0  ?? atorvastatin (LIPITOR) 10 MG tablet Take 10 mg by mouth daily.    ?? Biotin w/ Vitamins C & E (HAIR/SKIN/NAILS PO) Take 6,000 mcg by mouth.    ?? ergocalciferol (VITAMIN D2) 1.25 MG (50000 UT)  capsule Take 50,000 Units by mouth once a week.    ?? hydrochlorothiazide (HYDRODIURIL) 25 MG tablet TK 1 T PO QD IN THE MORNING FOR HIGH BP    ? ?No current facility-administered medications for this visit.  ? ?No results found. ? ?Review of Systems:   ?A ROS was performed including pertinent positives and negatives as documented in the HPI. ? ?Physical Exam :   ?Constitutional: NAD and appears stated age ?Neurological: Alert and oriented ?Psych: Appropriate affect and cooperative ?There were no vitals taken for this visit.  ? ?Comprehensive Musculoskeletal Exam:   ? ?There is bilateral patellofemoral crepitus.  Range of motion right knee is from 0 to 130 degrees with some pain.  No joint line tenderness.  Negative Lachman.  Negative McMurray bilaterally.  Sensation is intact in all descriptions of the right knee.  There is moderate effusion of the right knee ? ?Imaging:   ?Xray (4 views right knee, 4 views left knee): ?Bilateral tricompartmental moderate osteoarthritis most significantly involving the patellofemoral joint ? ? ? ?I personally reviewed and interpreted the radiographs. ? ? 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  At this time she is trialed multiple injections which are no longer effective.  We did discuss the role of PRP as being the last possible injection.  She would like to defer this.  I did discuss for her the role of bracing versus physical therapy for strengthening of her hip and core/quad as her symptoms are predominantly patellofemoral.  She would like to start with this.  At that this time she is elected for an additional injection of the right knee as she does have a cruise scheduled coming week. ? ?Plan :   ? ?-Right knee ultrasound-guided injection performed after verbal consent obtained ?-Follow-up as needed ? ? ? ?Procedure Note ? ?Patient: Katrina Evans             ?Date of Birth:  11/02/1950           ?MRN: [id number]             ?Visit Date: 01/24/2022 ? ?Procedures: ?Visit Diagnoses:  ?1. Pain in both knees, unspecified chronicity   ? ? ?Large Joint Inj: R knee on 01/24/2022 8:26 AM ?Indications: pain ?Details: 22 G 1.5 in needle, ultrasound-guided anterior approach ? ?Arthrogram: No ? ?Medications: 4 mL lidocaine 1 %; 80  mg triamcinolone acetonide 40 MG/ML ?Outcome: tolerated well, no immediate complications ?Procedure, treatment alternatives, risks and benefits explained, specific risks discussed. Consent was given by the patient. Immediately prior to procedure a time out was called to verify the correct patient, procedure, equipment, support staff and site/side marked as required. Patient was prepped and draped in the usual sterile fashion.  ? ? ? ? ? ? ?I personally saw and evaluated the patient, and participated in the management and treatment plan. ? ?Vanetta Mulders, MD ?Attending Physician, Orthopedic Surgery ? ?This document was dictated using Systems analyst. A reasonable attempt at proof reading has been made to minimize errors. ?

## 2022-01-29 DIAGNOSIS — N3000 Acute cystitis without hematuria: Secondary | ICD-10-CM | POA: Diagnosis not present

## 2022-02-22 DIAGNOSIS — Z01818 Encounter for other preprocedural examination: Secondary | ICD-10-CM | POA: Diagnosis not present

## 2022-02-22 DIAGNOSIS — N950 Postmenopausal bleeding: Secondary | ICD-10-CM | POA: Diagnosis not present

## 2022-02-22 DIAGNOSIS — N8501 Benign endometrial hyperplasia: Secondary | ICD-10-CM | POA: Diagnosis not present

## 2022-02-22 NOTE — H&P (Signed)
Katrina Evans is an 71 y.o. postmenopausal G3P3 who is admitted for Robotic Assisted Total Laparoscopic Hysterectomy with Bilateral Salpingo-oophorectomy for endometrial hyperplasia without atypia. ? ?Patient underwent Hysteroscopy with Dilation and Curettage with Myosure polypectomy and ECC for ASC-H pap smear on 08/26/2022. Reports postmenopausal bleeding at the end of 02/2021 and again 7/11-7/19/2022. The bleeding was light similar to a light period. Had another small episode since July 2022 as well. Pap smear revealed ASC-H pap smear which was evaluated with colposcopy. Cervical biopsy taken at 12 o'clock and ECC collected which were benign. However, ECC revealed minute fragments of benign endocervical glands, squamous metaplasia, blood, and mucus. On TVUS, there was an 24m area in the upper uterine segment that appeared consistent with an endometrial polyp.  ? ?The ECC pathology revealed benign endocervical and squamous cells. Surgical pathology from the Hysteroscopy D&C revealed focal simple hyperplasia without atypia with fragments of benign myometrium and absence of atypia or malignancy. ? ?After extensive discussion/counseling, patient was offered medication management with surveillance vs surgical management given risk factors. Patient initially opted for surveillance without progestins but ultimately opted for definitive surgical management, including bilateral oophorectomy. Patient felt it would be too difficult to endure more endometrial biopsies. ? ?Work-up: ?D&C: (08/26/21): Focal simple hyperplasia without atypia, fragments of benign endometrium, no atypia or malignancy ?ECC (08/26/21): Benign endocervical and squamous cells, no malignant cells identified ?Colposcopy (06/30/21): Cervical biopsy 12 o'clock - benign squamous mucosa, ECC - minute fragments of benign endocervical glands, squamous metaplasia, blood, and mucus ?Pap smear (05/19/21): ASC-H pap smear ?Endometrial biopsy (06/01/21): Scant atrophic  endometrium. No hyperplasia or carcinoma ?  ?TVUS (05/12/21):  ?Uterus 8.4 x 4.1 x 5.5cm, anteverted ?Heterogenous myometrium. Posterior right fundal leiomyoma 2.5 x 2.2 x 2.8cm, extends submucosal. No discrete masses seen. ?Endometrium thickness 437m Trace endometrial fluid. Question polyp centrally within endometrial canal at upper uterine segment 11 x 21m22mn sagittal images, not definitely imaged transverse. Bilateral ovaries normal. No pelvic free fluid. No adnexal masses.  ?Impression: Submucosal leiomyoma at right uterine fundus 2.8cm in diameter. Suspected 52m30mdometrial polyp at upper uterine segment. ? ? ?Patient Active Problem List  ? Diagnosis Date Noted  ? Class 1 obesity due to excess calories with body mass index (BMI) of 34.0 to 34.9 in adult 04/29/2021  ? Prediabetes 04/29/2021  ? Essential hypertension 04/29/2021  ? Hyperlipidemia 04/29/2021  ? ?MEDICAL/FAMILY/SOCIAL HX: ?No LMP recorded. Patient is postmenopausal. ?  ? ?Past Medical History:  ?Diagnosis Date  ? Edema of both lower extremities   ? High cholesterol   ? Hypertension   ? Joint pain   ? Obesity   ? Pre-diabetes   ? SOB (shortness of breath)   ? Vitamin D deficiency   ? ? ?Past Surgical History:  ?Procedure Laterality Date  ? BREAST BIOPSY Right   ? No Scar  ? DILATATION & CURETTAGE/HYSTEROSCOPY WITH MYOSURE N/A 08/26/2021  ? Procedure: DILATATION & CURETTAGE/HYSTEROSCOPY WITH MYOSURE;  Surgeon: DaviDrema Dallas;  [facility]: [facility]ne[facility];  Laterality: N/A;  ? LAPAROSCOPIC APPENDECTOMY N/A 08/17/2017  ? Procedure: APPENDECTOMY LAPAROSCOPIC;  Surgeon: HoxwExcell Seltzer;  Location: [facility];  Service: General;  Laterality: N/A;  ? laser vein surgery    ? ? ?Family History  ?Problem Relation Age of Onset  ? Diabetes Mother   ? High blood pressure Mother   ? High Cholesterol Mother   ? Stroke Mother   ? Cancer Mother   ? Depression Mother   ?  Schizophrenia Mother   ? Obesity Mother   ? High blood  pressure Father   ? High Cholesterol Father   ? Heart disease Father   ? Cancer Father   ? Alcoholism Father   ? Breast cancer Neg Hx   ? ? ?Social History:  reports that she quit smoking about 18 years ago. Her smoking use included cigarettes. She has a 35.00 pack-year smoking history. She has never used smokeless tobacco. She reports current alcohol use. She reports that she does not use drugs. ? ?ALLERGIES/MEDS: ? ?Allergies:  ?Allergies  ?Allergen Reactions  ? Diclofenac Nausea Only  ?  Pt stated, "Upset my stomach"  ? ? ?No medications prior to admission.  ? ? ? ?Review of Systems  ?Constitutional: Negative.   ?HENT: Negative.    ?Eyes: Negative.   ?Respiratory: Negative.    ?Cardiovascular: Negative.   ?Gastrointestinal: Negative.   ?Genitourinary: Negative.   ?Musculoskeletal: Negative.   ?Skin: Negative.   ?Neurological: Negative.   ?Endo/Heme/Allergies: Negative.   ?Psychiatric/Behavioral: Negative.    ? ?There were no vitals taken for this visit. ?Gen:  NAD, pleasant and cooperative ?Cardio:  RRR ?Pulm:  CTAB, no wheezes/rales/rhonchi ?Abd:  Soft, non-distended, non-tender throughout, no rebound/guarding ?Ext:  No bilateral LE edema, no bilateral calf tenderness ?Pelvic: Labia - unremarkable, vagina - pink moist mucosa, no lesions or abnormal discharge, cervix - no discharge or lesions or CMT ? ? ?No results found for this or any previous visit (from the past 24 hour(s)). ? ?No results found. ? ? ?ASSESSMENT/PLAN: ?Katrina Evans is a 71 y.o. G3P3 who is admitted for Robotic Assisted Total Laparoscopic Hysterectomy with Bilateral Salpingo-oophorectomy for endometrial hyperplasia without atypia. ? ?- Admit to [facility] ?- Admit labs (CBC, T&S, COVID screen per protocol) ?- Diet:  NPO ?- IVF:  Per anesthesia ?- VTE Prophylaxis:  SCDs ?- Antibiotics: Ancef 2g on call to OR ?- D/C home likely POD#1 ? ?Consents: ?I have explained to the patient that his surgery is performed to remove the uterus through several  small incisions in the abdomen and that it will result in sterility.  I discussed the risks and benefits of the surgery, including, but not limited to bleeding, including the need for blood transfusion, infection, damage to surround organs and tissues, damage to bladder, damage to ureters, causing kidney damage, and requiring additional procedures, damage to bowels, resulting in further surgery, postoperative pain, short-term and long-term, scarring on the abdominal wall and intra-abdominally, need for further surgery, need for conversion to an open procedure, development of an incisional hernia, deep vein thrombosis, and/or pulmonary embolism, wound infection and/or separation, painful intercourse, urinary leakage, fistula formation, complications the course of which cannot be predicted or prevented, and death. ?Patient was consented for blood products.  The patient is aware that bleeding may result in the need for a blood transfusion which includes risk of transmission of HIV (1:2 million), Hepatitis C (1:2 million), and Hepatitis B (1:200 thousand) and transfusion reaction.  Patient voiced understanding of the above risks as well as understanding of indications for blood transfusion. ? ?[name], DO ?[phone] (office) ? ? ? ?

## 2022-03-01 ENCOUNTER — Encounter (HOSPITAL_BASED_OUTPATIENT_CLINIC_OR_DEPARTMENT_OTHER): Payer: Self-pay | Admitting: Obstetrics and Gynecology

## 2022-03-02 ENCOUNTER — Encounter (HOSPITAL_BASED_OUTPATIENT_CLINIC_OR_DEPARTMENT_OTHER): Payer: Self-pay | Admitting: Obstetrics and Gynecology

## 2022-03-02 ENCOUNTER — Other Ambulatory Visit: Payer: Self-pay

## 2022-03-02 NOTE — Progress Notes (Signed)
Spoke w/ via phone for pre-op interview---Alixandrea ?Lab needs dos----none per anesthesia, surgeon orders pending as of 03/02/22              ?Lab results------03/04/22 lab appt for CBC, BMP, type & screen, 04/29/21 EKG in chart & Epic ?COVID test -----patient states asymptomatic no test needed ?Arrive at -------0530 on 03/08/22 ?NPO after MN NO Solid Food.  Clear liquids from MN until---0430 ?Med rec completed ?Medications to take morning of surgery -----Atorvastatin (Lipitor), Claritin if needed ?Diabetic medication -----n/a ?Patient instructed no nail polish to be worn day of surgery ?Patient instructed to bring photo id and insurance card day of surgery ?Patient aware to have Driver (ride ) / caregiver    for 24 hours after surgery - daughter, Jeral Fruit ?Patient Special Instructions -----Extended / overnight stay instructions given. ?Pre-Op special Istructions -----Requested orders from Dr. Delora Fuel via Epic IB on 03/01/22. ?Patient verbalized understanding of instructions that were given at this phone interview. ?Patient denies shortness of breath, chest pain, fever, cough at this phone interview.  ?

## 2022-03-02 NOTE — Progress Notes (Signed)
? ? Your procedure is scheduled on Tuesday, Mar 08, 2022/ ? Report to [facility] ? Call this number if you have problems the morning of surgery  :[phone]. ? ? [facility] [place].  [facility] ARE LOCATED IN [facility]. ? ?PLEASE BRING YOUR INSURANCE CARD AND PHOTO ID DAY OF SURGERY. ? ?ONLY 2 PEOPLE ARE ALLOWED IN  WAITING  ROOM.  ?                                   ? REMEMBER: ? DO NOT EAT FOOD, CANDY GUM OR MINTS  AFTER MIDNIGHT THE NIGHT BEFORE YOUR SURGERY . YOU MAY HAVE CLEAR LIQUIDS FROM MIDNIGHT THE NIGHT BEFORE YOUR SURGERY UNTIL  4:30 AM. NO CLEAR LIQUIDS AFTER   4:30 AM DAY OF SURGERY. ? ?YOU MAY  BRUSH YOUR TEETH MORNING OF SURGERY AND RINSE YOUR MOUTH OUT, NO CHEWING GUM CANDY OR MINTS. ? ? ? ? ?CLEAR LIQUID DIET ? ? ?Foods Allowed                                                                     Foods Excluded ? ?Coffee and tea, regular and decaf                             liquids that you cannot  ?Plain Jell-O                                                                   see through such as: ?Fruit ices (not with fruit pulp)                                     milk, soups, orange juice  ?Plain  Popsicles                                    All solid food ?Carbonated beverages, regular and diet                                    ?Cranberry, grape and apple juices ?Sports drinks like Gatorade ?_____________________________________________________________________ ?  ? ? TAKE THESE MEDICATIONS MORNING OF SURGERY: Lipitor (atorvastatin), Claritin if needed ? ? ? UP TO 4 VISITORS  MAY VISIT IN THE EXTENDED RECOVERY ROOM UNTIL 800 PM ONLY.  1 VISITOR AGE 69 AND OVER MAY SPEND THE NIGHT AND MUST BE IN EXTENDED RECOVERY ROOM NO LATER THAN 800 PM . YOUR DISCHARGE TIME AFTER YOU SPEND THE NIGHT IS 900 AM THE MORNING AFTER YOUR SURGERY. ? ?YOU MAY PACK A SMALL OVERNIGHT BAG WITH TOILETRIES  FOR YOUR OVERNIGHT STAY IF YOU WISH. ? ?YOUR  PRESCRIPTION MEDICATIONS WILL BE PROVIDED DURING [facility]. ? ? ?                                   ?DO NOT WEAR JEWERLY, MAKE UP. ?DO NOT WEAR LOTIONS, POWDERS, PERFUMES OR NAIL POLISH ON YOUR FINGERNAILS. TOENAIL POLISH IS OK TO WEAR. ?DO NOT SHAVE FOR 48 HOURS PRIOR TO DAY OF SURGERY. ?MEN MAY SHAVE FACE AND NECK. ?CONTACTS, GLASSES, OR DENTURES MAY NOT BE WORN TO SURGERY. ? ?REMEMBER: NO SMOKING, DRUGS OR ALCOHOL FOR 24 HOURS BEFORE YOUR SURGERY. ?                                   ?[facility] IS NOT RESPONSIBLE  FOR ANY BELONGINGS.                                  ?                                  . ?          [facility] - Preparing for Surgery ?Before surgery, you can play an important role.  Because skin is not sterile, your skin needs to be as free of germs as possible.  You can reduce the number of germs on your skin by washing with CHG (chlorahexidine gluconate) soap before surgery.  CHG is an antiseptic cleaner which kills germs and bonds with the skin to continue killing germs even after washing. ?Please DO NOT use if you have an allergy to CHG or antibacterial soaps.  If your skin becomes reddened/irritated stop using the CHG and inform your nurse when you arrive at Short Stay. ?Do not shave (including legs and underarms) for at least 48 hours prior to the first CHG shower.  You may shave your face/neck. ?Please follow these instructions carefully: ? 1.  Shower with CHG Soap the night before surgery and the  morning of Surgery. ? 2.  If you choose to wash your hair, wash your hair first as usual with your  normal  shampoo. ? 3.  After you shampoo, rinse your hair and body thoroughly to remove the  shampoo.                            ?4.  Use CHG as you would any other liquid soap.  You can apply chg directly  to the skin and wash , please wash your belly button thoroughly with chg soap provided night before and morning of your surgery. ?                    Gently with a scrungie or clean  washcloth. ? 5.  Apply the CHG Soap to your body ONLY FROM THE NECK DOWN.   Do not use on face/ open      ?                     Wound or open sores. Avoid contact with eyes, ears mouth and genitals (private parts).  ?  Wash face,  Genitals (private parts) with your normal soap. ?            6.  Wash thoroughly, paying special attention to the area where your surgery  will be performed. ? 7.  Thoroughly rinse your body with warm water from the neck down. ? 8.  DO NOT shower/wash with your normal soap after using and rinsing off  the CHG Soap. ?               9.  Pat yourself dry with a clean towel. ?           10.  Wear clean pajamas. ?           11.  Place clean sheets on your bed the night of your first shower and do not  sleep with pets. ?Day of Surgery : ?Do not apply any lotions/deodorants the morning of surgery.  Please wear clean clothes to the hospital/surgery center. ? ?IF YOU HAVE ANY SKIN IRRITATION OR PROBLEMS WITH THE SURGICAL SOAP, PLEASE GET A BAR OF GOLD DIAL SOAP AND SHOWER THE NIGHT BEFORE YOUR SURGERY AND THE MORNING OF YOUR SURGERY. PLEASE LET THE NURSE KNOW MORNING OF YOUR SURGERY IF YOU HAD ANY PROBLEMS WITH THE SURGICAL SOAP. ? ? ?________________________________________________________________________                  ?                                    ?  QUESTIONS CALL [facility] PRE OP NURSE PHONE [phone].                                    ?

## 2022-03-04 ENCOUNTER — Encounter (HOSPITAL_COMMUNITY)
Admission: RE | Admit: 2022-03-04 | Discharge: 2022-03-04 | Disposition: A | Discharge status: Home / Self Care | Payer: Medicare HMO | Source: Ambulatory Visit | Attending: Obstetrics and Gynecology | Admitting: Obstetrics and Gynecology

## 2022-03-04 DIAGNOSIS — N8500 Endometrial hyperplasia, unspecified: Secondary | ICD-10-CM | POA: Diagnosis not present

## 2022-03-04 DIAGNOSIS — Z01818 Encounter for other preprocedural examination: Secondary | ICD-10-CM

## 2022-03-04 DIAGNOSIS — Z01812 Encounter for preprocedural laboratory examination: Secondary | ICD-10-CM | POA: Insufficient documentation

## 2022-03-04 LAB — BASIC METABOLIC PANEL
Anion gap: 11 (ref 5–15)
BUN: 25 mg/dL — ABNORMAL HIGH (ref 8–23)
CO2: 26 mmol/L (ref 22–32)
Calcium: 9.7 mg/dL (ref 8.9–10.3)
Chloride: 101 mmol/L (ref 98–111)
Creatinine, Ser: 0.66 mg/dL (ref 0.44–1.00)
GFR, Estimated: >60 mL/min (ref >60)
Glucose, Bld: 79 mg/dL (ref 70–99)
Potassium: 3.7 mmol/L (ref 3.5–5.1)
Sodium: 138 mmol/L (ref 135–145)

## 2022-03-04 LAB — CBC
HCT: 44.4 % (ref 36.0–46.0)
Hemoglobin: 14.2 g/dL (ref 12.0–15.0)
MCH: 30.9 pg (ref 26.0–34.0)
MCHC: 32 g/dL (ref 30.0–36.0)
MCV: 96.7 fL (ref 80.0–100.0)
Platelets: 196 10*3/uL (ref 150–400)
RBC: 4.59 MIL/uL (ref 3.87–5.11)
RDW: 13.2 % (ref 11.5–15.5)
WBC: 6.1 10*3/uL (ref 4.0–10.5)
nRBC: 0 % (ref 0.0–0.2)

## 2022-03-08 ENCOUNTER — Other Ambulatory Visit: Payer: Self-pay

## 2022-03-08 ENCOUNTER — Encounter (HOSPITAL_BASED_OUTPATIENT_CLINIC_OR_DEPARTMENT_OTHER)
Admission: RE | Disposition: A | Discharge status: Home / Self Care | Payer: Self-pay | Source: Home / Self Care | Attending: Obstetrics and Gynecology

## 2022-03-08 ENCOUNTER — Ambulatory Visit (HOSPITAL_BASED_OUTPATIENT_CLINIC_OR_DEPARTMENT_OTHER): Payer: Medicare HMO | Admitting: Anesthesiology

## 2022-03-08 ENCOUNTER — Ambulatory Visit (HOSPITAL_BASED_OUTPATIENT_CLINIC_OR_DEPARTMENT_OTHER)
Admission: RE | Admit: 2022-03-08 | Discharge: 2022-03-09 | Disposition: A | Discharge status: Home / Self Care | Payer: Medicare HMO | Attending: Obstetrics and Gynecology | Admitting: Obstetrics and Gynecology

## 2022-03-08 ENCOUNTER — Encounter (HOSPITAL_BASED_OUTPATIENT_CLINIC_OR_DEPARTMENT_OTHER): Payer: Self-pay | Admitting: Obstetrics and Gynecology

## 2022-03-08 DIAGNOSIS — Z6834 Body mass index (BMI) 34.0-34.9, adult: Secondary | ICD-10-CM | POA: Insufficient documentation

## 2022-03-08 DIAGNOSIS — E669 Obesity, unspecified: Secondary | ICD-10-CM | POA: Insufficient documentation

## 2022-03-08 DIAGNOSIS — N8500 Endometrial hyperplasia, unspecified: Secondary | ICD-10-CM | POA: Diagnosis not present

## 2022-03-08 DIAGNOSIS — I10 Essential (primary) hypertension: Secondary | ICD-10-CM | POA: Insufficient documentation

## 2022-03-08 DIAGNOSIS — D259 Leiomyoma of uterus, unspecified: Secondary | ICD-10-CM

## 2022-03-08 DIAGNOSIS — R7303 Prediabetes: Secondary | ICD-10-CM | POA: Diagnosis not present

## 2022-03-08 DIAGNOSIS — Z833 Family history of diabetes mellitus: Secondary | ICD-10-CM | POA: Insufficient documentation

## 2022-03-08 DIAGNOSIS — Z780 Asymptomatic menopausal state: Secondary | ICD-10-CM | POA: Insufficient documentation

## 2022-03-08 DIAGNOSIS — Z8349 Family history of other endocrine, nutritional and metabolic diseases: Secondary | ICD-10-CM | POA: Diagnosis not present

## 2022-03-08 DIAGNOSIS — Z01818 Encounter for other preprocedural examination: Secondary | ICD-10-CM

## 2022-03-08 DIAGNOSIS — K219 Gastro-esophageal reflux disease without esophagitis: Secondary | ICD-10-CM | POA: Diagnosis not present

## 2022-03-08 DIAGNOSIS — Z8249 Family history of ischemic heart disease and other diseases of the circulatory system: Secondary | ICD-10-CM | POA: Insufficient documentation

## 2022-03-08 DIAGNOSIS — N8003 Adenomyosis of the uterus: Secondary | ICD-10-CM | POA: Diagnosis not present

## 2022-03-08 DIAGNOSIS — Z79899 Other long term (current) drug therapy: Secondary | ICD-10-CM | POA: Insufficient documentation

## 2022-03-08 DIAGNOSIS — Z87891 Personal history of nicotine dependence: Secondary | ICD-10-CM | POA: Insufficient documentation

## 2022-03-08 DIAGNOSIS — N8501 Benign endometrial hyperplasia: Secondary | ICD-10-CM | POA: Diagnosis not present

## 2022-03-08 HISTORY — DX: Gastro-esophageal reflux disease without esophagitis: K21.9

## 2022-03-08 HISTORY — PX: ROBOTIC ASSISTED LAPAROSCOPIC HYSTERECTOMY AND SALPINGECTOMY: SHX6379

## 2022-03-08 HISTORY — DX: Malignant (primary) neoplasm, unspecified: C80.1

## 2022-03-08 LAB — TYPE AND SCREEN
ABO/RH(D): A POS
Antibody Screen: NEGATIVE

## 2022-03-08 SURGERY — XI ROBOTIC ASSISTED LAPAROSCOPIC HYSTERECTOMY AND SALPINGECTOMY
Anesthesia: General | Site: Abdomen | Laterality: Bilateral

## 2022-03-08 MED ORDER — ONDANSETRON HCL 4 MG/2ML IJ SOLN: 4.0000 mg | Freq: Four times a day (QID) | INTRAMUSCULAR | Status: DC | PRN

## 2022-03-08 MED ORDER — FENTANYL CITRATE (PF) 100 MCG/2ML IJ SOLN
INTRAMUSCULAR | Status: DC | PRN
Start: 2022-03-08 — End: 2022-03-08

## 2022-03-08 MED ORDER — CEFAZOLIN SODIUM-DEXTROSE 2-4 GM/100ML-% IV SOLN
INTRAVENOUS | Status: AC
  Filled 2022-03-08: qty 100

## 2022-03-08 MED ORDER — MIDAZOLAM HCL 5 MG/5ML IJ SOLN
INTRAMUSCULAR | Status: DC | PRN
  Administered 2022-03-08: 2 mg via INTRAVENOUS

## 2022-03-08 MED ORDER — DOCUSATE SODIUM 100 MG PO CAPS
ORAL_CAPSULE | ORAL | Status: AC
  Filled 2022-03-08: qty 1

## 2022-03-08 MED ORDER — LACTATED RINGERS IV SOLN: INTRAVENOUS | Status: DC

## 2022-03-08 MED ORDER — STERILE WATER FOR IRRIGATION IR SOLN
Status: DC | PRN
  Administered 2022-03-08: 500 mL

## 2022-03-08 MED ORDER — DEXAMETHASONE SODIUM PHOSPHATE 4 MG/ML IJ SOLN
INTRAMUSCULAR | Status: DC | PRN
  Administered 2022-03-08: 10 mg via INTRAVENOUS

## 2022-03-08 MED ORDER — AMISULPRIDE (ANTIEMETIC) 5 MG/2ML IV SOLN: 10.0000 mg | Freq: Once | INTRAVENOUS | Status: DC | PRN

## 2022-03-08 MED ORDER — ONDANSETRON HCL 4 MG/2ML IJ SOLN: 4.0000 mg | Freq: Once | INTRAMUSCULAR | Status: DC | PRN

## 2022-03-08 MED ORDER — HEMOSTATIC AGENTS (NO CHARGE) OPTIME
TOPICAL | Status: DC | PRN
  Administered 2022-03-08: 1 via TOPICAL

## 2022-03-08 MED ORDER — OXYCODONE HCL 5 MG PO TABS: 5.0000 mg | ORAL_TABLET | ORAL | 0 refills | Status: DC | PRN

## 2022-03-08 MED ORDER — ACETAMINOPHEN 500 MG PO TABS
1000.0000 mg | ORAL_TABLET | Freq: Once | ORAL | Status: AC
Start: 2022-03-08 — End: 2022-03-08
  Administered 2022-03-08: 1000 mg via ORAL

## 2022-03-08 MED ORDER — SODIUM CHLORIDE 0.9 % IR SOLN
Status: DC | PRN
  Administered 2022-03-08: 400 mL

## 2022-03-08 MED ORDER — ROCURONIUM BROMIDE 100 MG/10ML IV SOLN
INTRAVENOUS | Status: DC | PRN
Start: 2022-03-08 — End: 2022-03-08
  Administered 2022-03-08: 60 mg via INTRAVENOUS
  Administered 2022-03-08: 20 mg via INTRAVENOUS

## 2022-03-08 MED ORDER — ONDANSETRON HCL 4 MG PO TABS
4.0000 mg | ORAL_TABLET | Freq: Four times a day (QID) | ORAL | Status: DC | PRN
  Administered 2022-03-08: 4 mg via ORAL

## 2022-03-08 MED ORDER — SIMETHICONE 80 MG PO CHEW: 80.0000 mg | CHEWABLE_TABLET | Freq: Four times a day (QID) | ORAL | Status: DC | PRN

## 2022-03-08 MED ORDER — ACETAMINOPHEN 500 MG PO TABS
ORAL_TABLET | ORAL | Status: AC
  Filled 2022-03-08: qty 2

## 2022-03-08 MED ORDER — FENTANYL CITRATE (PF) 100 MCG/2ML IJ SOLN: 25.0000 ug | INTRAMUSCULAR | Status: DC | PRN

## 2022-03-08 MED ORDER — SUGAMMADEX SODIUM 200 MG/2ML IV SOLN
INTRAVENOUS | Status: DC | PRN
  Administered 2022-03-08: 200 mg via INTRAVENOUS

## 2022-03-08 MED ORDER — OXYCODONE HCL 5 MG PO TABS
5.0000 mg | ORAL_TABLET | ORAL | Status: DC | PRN
  Administered 2022-03-08 – 2022-03-09 (×3): 5 mg via ORAL

## 2022-03-08 MED ORDER — CEFAZOLIN SODIUM-DEXTROSE 2-4 GM/100ML-% IV SOLN
2.0000 g | INTRAVENOUS | Status: AC
  Administered 2022-03-08: 2 g via INTRAVENOUS

## 2022-03-08 MED ORDER — OXYCODONE HCL 5 MG PO TABS
ORAL_TABLET | ORAL | Status: AC
  Filled 2022-03-08: qty 1

## 2022-03-08 MED ORDER — PROPOFOL 10 MG/ML IV BOLUS
INTRAVENOUS | Status: AC
Start: 2022-03-08 — End: ?
  Filled 2022-03-08: qty 20

## 2022-03-08 MED ORDER — PROPOFOL 10 MG/ML IV BOLUS
INTRAVENOUS | Status: DC | PRN
  Administered 2022-03-08: 50 mg via INTRAVENOUS
  Administered 2022-03-08: 150 mg via INTRAVENOUS

## 2022-03-08 MED ORDER — SILVER NITRATE-POT NITRATE 75-25 % EX MISC
CUTANEOUS | Status: DC | PRN
  Administered 2022-03-08: 1

## 2022-03-08 MED ORDER — ROCURONIUM BROMIDE 10 MG/ML (PF) SYRINGE
PREFILLED_SYRINGE | INTRAVENOUS | Status: AC
  Filled 2022-03-08: qty 10

## 2022-03-08 MED ORDER — PANTOPRAZOLE SODIUM 40 MG IV SOLR
40.0000 mg | Freq: Every day | INTRAVENOUS | Status: DC
  Administered 2022-03-08: 40 mg via INTRAVENOUS

## 2022-03-08 MED ORDER — PROPOFOL 10 MG/ML IV BOLUS
INTRAVENOUS | Status: AC
  Filled 2022-03-08: qty 20

## 2022-03-08 MED ORDER — MIDAZOLAM HCL 2 MG/2ML IJ SOLN
INTRAMUSCULAR | Status: AC
  Filled 2022-03-08: qty 2

## 2022-03-08 MED ORDER — ACETAMINOPHEN 500 MG PO TABS
ORAL_TABLET | ORAL | Status: AC
Start: 2022-03-08 — End: ?
  Filled 2022-03-08: qty 2

## 2022-03-08 MED ORDER — DOCUSATE SODIUM 100 MG PO CAPS
100.0000 mg | ORAL_CAPSULE | Freq: Two times a day (BID) | ORAL | Status: DC
  Administered 2022-03-08 (×2): 100 mg via ORAL

## 2022-03-08 MED ORDER — ONDANSETRON 4 MG PO TBDP
ORAL_TABLET | ORAL | Status: AC
  Filled 2022-03-08: qty 1

## 2022-03-08 MED ORDER — ONDANSETRON HCL 4 MG/2ML IJ SOLN
INTRAMUSCULAR | Status: DC | PRN
  Administered 2022-03-08: 4 mg via INTRAVENOUS

## 2022-03-08 MED ORDER — DEXAMETHASONE SODIUM PHOSPHATE 10 MG/ML IJ SOLN
INTRAMUSCULAR | Status: AC
  Filled 2022-03-08: qty 1

## 2022-03-08 MED ORDER — PANTOPRAZOLE SODIUM 40 MG IV SOLR
INTRAVENOUS | Status: AC
Start: 2022-03-08 — End: ?
  Filled 2022-03-08: qty 10

## 2022-03-08 MED ORDER — ACETAMINOPHEN 500 MG PO TABS
1000.0000 mg | ORAL_TABLET | Freq: Four times a day (QID) | ORAL | Status: DC
  Administered 2022-03-08 – 2022-03-09 (×4): 1000 mg via ORAL

## 2022-03-08 MED ORDER — FENTANYL CITRATE (PF) 100 MCG/2ML IJ SOLN
INTRAMUSCULAR | Status: AC
  Filled 2022-03-08: qty 2

## 2022-03-08 MED ORDER — MORPHINE SULFATE (PF) 4 MG/ML IV SOLN: 1.0000 mg | INTRAVENOUS | Status: DC | PRN

## 2022-03-08 MED ORDER — KETOROLAC TROMETHAMINE 30 MG/ML IJ SOLN
INTRAMUSCULAR | Status: DC | PRN
  Administered 2022-03-08: 30 mg via INTRAVENOUS

## 2022-03-08 MED ORDER — ONDANSETRON HCL 4 MG/2ML IJ SOLN
INTRAMUSCULAR | Status: AC
  Filled 2022-03-08: qty 2

## 2022-03-08 MED ORDER — ACETAMINOPHEN 500 MG PO TABS: 1000.0000 mg | ORAL_TABLET | Freq: Four times a day (QID) | ORAL | 0 refills | Status: DC

## 2022-03-08 MED ORDER — LIDOCAINE HCL (PF) 2 % IJ SOLN
INTRAMUSCULAR | Status: AC
  Filled 2022-03-08: qty 5

## 2022-03-08 MED ORDER — SODIUM CHLORIDE (PF) 0.9 % IJ SOLN
INTRAMUSCULAR | Status: DC | PRN
  Administered 2022-03-08: 90 mL via SURGICAL_CAVITY

## 2022-03-08 MED ORDER — LIDOCAINE HCL (CARDIAC) PF 100 MG/5ML IV SOSY
PREFILLED_SYRINGE | INTRAVENOUS | Status: DC | PRN
  Administered 2022-03-08: 60 mg via INTRAVENOUS

## 2022-03-08 MED ORDER — FENTANYL CITRATE (PF) 250 MCG/5ML IJ SOLN
INTRAMUSCULAR | Status: DC | PRN
  Administered 2022-03-08: 50 ug via INTRAVENOUS
  Administered 2022-03-08: 150 ug via INTRAVENOUS
  Administered 2022-03-08 (×3): 50 ug via INTRAVENOUS

## 2022-03-08 MED ORDER — FENTANYL CITRATE (PF) 250 MCG/5ML IJ SOLN
INTRAMUSCULAR | Status: AC
  Filled 2022-03-08: qty 5

## 2022-03-08 SURGICAL SUPPLY — 79 items
ADH SKN CLS APL DERMABOND .7 (GAUZE/BANDAGES/DRESSINGS) ×1
APL SRG 38 LTWT LNG FL B (MISCELLANEOUS) ×1
APPLICATOR ARISTA FLEXITIP XL (MISCELLANEOUS) ×1 IMPLANT
BARRIER ADHS 3X4 INTERCEED (GAUZE/BANDAGES/DRESSINGS) IMPLANT
BRR ADH 4X3 ABS CNTRL BYND (GAUZE/BANDAGES/DRESSINGS)
CATH FOLEY 3WAY  5CC 16FR (CATHETERS) ×1
CATH FOLEY 3WAY 5CC 16FR (CATHETERS) ×1 IMPLANT
CELLS DAT CNTRL 66122 CELL SVR (MISCELLANEOUS) IMPLANT
COVER BACK TABLE 60X90IN (DRAPES) ×2 IMPLANT
COVER TIP SHEARS 8 DVNC (MISCELLANEOUS) ×1 IMPLANT
COVER TIP SHEARS 8MM DA VINCI (MISCELLANEOUS) ×1
DECANTER SPIKE VIAL GLASS SM (MISCELLANEOUS) ×2 IMPLANT
DEFOGGER SCOPE WARMER CLEARIFY (MISCELLANEOUS) ×2 IMPLANT
DERMABOND ADVANCED (GAUZE/BANDAGES/DRESSINGS) ×1
DERMABOND ADVANCED .7 DNX12 (GAUZE/BANDAGES/DRESSINGS) ×1 IMPLANT
DILATOR CANAL MILEX (MISCELLANEOUS) ×2 IMPLANT
DRAPE ARM DVNC X/XI (DISPOSABLE) ×4 IMPLANT
DRAPE COLUMN DVNC XI (DISPOSABLE) ×1 IMPLANT
DRAPE DA VINCI XI ARM (DISPOSABLE) ×4
DRAPE DA VINCI XI COLUMN (DISPOSABLE) ×1
DRAPE UTILITY 15X26 TOWEL STRL (DRAPES) ×2 IMPLANT
DRAPE UTILITY XL STRL (DRAPES) ×2 IMPLANT
DURAPREP 26ML APPLICATOR (WOUND CARE) ×2 IMPLANT
ELECT REM PT RETURN 9FT ADLT (ELECTROSURGICAL) ×2
ELECTRODE REM PT RTRN 9FT ADLT (ELECTROSURGICAL) ×1 IMPLANT
GAUZE 4X4 16PLY ~~LOC~~+RFID DBL (SPONGE) ×4 IMPLANT
GLOVE BIOGEL M 6.5 STRL (GLOVE) ×6 IMPLANT
GLOVE BIOGEL PI IND STRL 6.5 (GLOVE) ×6 IMPLANT
GLOVE BIOGEL PI IND STRL 7.0 (GLOVE) ×6 IMPLANT
GLOVE BIOGEL PI INDICATOR 6.5 (GLOVE) ×6
GLOVE BIOGEL PI INDICATOR 7.0 (GLOVE) ×6
HEMOSTAT ARISTA ABSORB 3G PWDR (HEMOSTASIS) ×1 IMPLANT
HIBICLENS CHG 4% 4OZ BTL (MISCELLANEOUS) ×2 IMPLANT
HOLDER FOLEY CATH W/STRAP (MISCELLANEOUS) IMPLANT
IRRIG SUCT STRYKERFLOW 2 WTIP (MISCELLANEOUS) ×2
IRRIGATION SUCT STRKRFLW 2 WTP (MISCELLANEOUS) ×1 IMPLANT
IV NS 1000ML (IV SOLUTION) ×2
IV NS 1000ML BAXH (IV SOLUTION) IMPLANT
KIT TURNOVER CYSTO (KITS) ×2 IMPLANT
LEGGING LITHOTOMY PAIR STRL (DRAPES) ×2 IMPLANT
OBTURATOR OPTICAL STANDARD 8MM (TROCAR)
OBTURATOR OPTICAL STND 8 DVNC (TROCAR)
OBTURATOR OPTICALSTD 8 DVNC (TROCAR) IMPLANT
OCCLUDER COLPOPNEUMO (BALLOONS) ×2 IMPLANT
PACK ROBOT WH (CUSTOM PROCEDURE TRAY) ×2 IMPLANT
PACK ROBOTIC GOWN (GOWN DISPOSABLE) ×2 IMPLANT
PACK TRENDGUARD 450 HYBRID PRO (MISCELLANEOUS) IMPLANT
PAD OB MATERNITY 4.3X12.25 (PERSONAL CARE ITEMS) ×2 IMPLANT
PAD PREP 24X48 CUFFED NSTRL (MISCELLANEOUS) ×2 IMPLANT
PROTECTOR NERVE ULNAR (MISCELLANEOUS) ×1 IMPLANT
RETRACTOR WND ALEXIS 18 MED (MISCELLANEOUS) IMPLANT
RTRCTR WOUND ALEXIS 18CM MED (MISCELLANEOUS)
RTRCTR WOUND ALEXIS 18CM SML (INSTRUMENTS)
SAVER CELL AAL HAEMONETICS (INSTRUMENTS) IMPLANT
SCISSORS LAP 5X45 EPIX DISP (ENDOMECHANICALS) ×1 IMPLANT
SEAL CANN UNIV 5-8 DVNC XI (MISCELLANEOUS) ×4 IMPLANT
SEAL XI 5MM-8MM UNIVERSAL (MISCELLANEOUS) ×4
SEALER VESSEL DA VINCI XI (MISCELLANEOUS) ×1
SEALER VESSEL EXT DVNC XI (MISCELLANEOUS) IMPLANT
SET IRRIG Y TYPE TUR BLADDER L (SET/KITS/TRAYS/PACK) IMPLANT
SET TRI-LUMEN FLTR TB AIRSEAL (TUBING) ×1 IMPLANT
SPONGE T-LAP 4X18 ~~LOC~~+RFID (SPONGE) ×1 IMPLANT
SUT VIC AB 0 CT1 27 (SUTURE) ×4
SUT VIC AB 0 CT1 27XBRD ANBCTR (SUTURE) ×2 IMPLANT
SUT VICRYL 0 UR6 27IN ABS (SUTURE) IMPLANT
SUT VICRYL 4-0 PS2 18IN ABS (SUTURE) ×1 IMPLANT
SUT VICRYL RAPIDE 4/0 PS 2 (SUTURE) ×6 IMPLANT
SUT VLOC 180 0 9IN  GS21 (SUTURE) ×1
SUT VLOC 180 0 9IN GS21 (SUTURE) ×1 IMPLANT
TIP RUMI ORANGE 6.7MMX12CM (TIP) IMPLANT
TIP UTERINE 5.1X6CM LAV DISP (MISCELLANEOUS) IMPLANT
TIP UTERINE 6.7X10CM GRN DISP (MISCELLANEOUS) IMPLANT
TIP UTERINE 6.7X6CM WHT DISP (MISCELLANEOUS) IMPLANT
TIP UTERINE 6.7X8CM BLUE DISP (MISCELLANEOUS) ×1 IMPLANT
TOWEL OR 17X26 10 PK STRL BLUE (TOWEL DISPOSABLE) ×2 IMPLANT
TRENDGUARD 450 HYBRID PRO PACK (MISCELLANEOUS) ×2
TROCAR PORT AIRSEAL 8X120 (TROCAR) ×2 IMPLANT
WATER STERILE IRR 1000ML POUR (IV SOLUTION) ×1 IMPLANT
WATER STERILE IRR 500ML POUR (IV SOLUTION) ×1 IMPLANT

## 2022-03-08 NOTE — Anesthesia Procedure Notes (Signed)
Procedure Name: Intubation ?Date/Time: 03/08/2022 7:44 AM ?Performed by: Justice Rocher, CRNA ?Pre-anesthesia Checklist: Patient identified, Emergency Drugs available, Suction available, Patient being monitored and Timeout performed ?Patient Re-evaluated:Patient Re-evaluated prior to induction ?Oxygen Delivery Method: Circle system utilized ?Preoxygenation: Pre-oxygenation with 100% oxygen ?Induction Type: IV induction ?Ventilation: Mask ventilation without difficulty ?Laryngoscope Size: Mac and 3 ?Grade View: Grade II ?Tube type: Oral ?Tube size: 7.0 mm ?Number of attempts: 1 ?Airway Equipment and Method: Stylet and Oral airway ?Placement Confirmation: ETT inserted through vocal cords under direct vision, positive ETCO2, breath sounds checked- equal and bilateral and CO2 detector ?Secured at: 23 cm ?Tube secured with: Tape ?Dental Injury: Teeth and Oropharynx as per pre-operative assessment  ? ? ? ? ?

## 2022-03-08 NOTE — Discharge Summary (Signed)
Physician Discharge Summary  ?Patient ID: ?Katrina Evans ?MRN: [id number] ?DOB/AGE: [birth year] 71 y.o. ? ?Admit date: 03/08/2022 ?Discharge date: 03/09/2022 ? ?Admission Diagnoses: ?Endometrial hyperplasia without atypia ?Uterine fibroid ? ?Discharge Diagnoses:  ?Principal Problem: ?  Endometrial hyperplasia without atypia ?Uterine fibroid ? ?Procedure(s): Robotic Assisted Total Laparoscopic Hysterectomy with Bilateral Salpingo-oophorectomy ? ?Discharged Condition: good ? ?Hospital Course: Patient was admitted on 03/08/2022 for the above named procedure(s) for the above named diagnoses. Prior to hospital discharge, patient was tolerating PO, ambulating, voiding spontaneously, passing flatus, and pain was well-controlled. See hospital chart for specific details. Patient was discharged home in stable condition. ? ?Consults: None ? ?Significant Diagnostic Studies: None ? ?Treatments: surgery: See above ? ?Discharge Exam: ?Blood pressure (!) 127/55, pulse 68, temperature 98.1 ?F (36.7 ?C), resp. rate 18, height '5\' 2"'$  (1.575 m), weight 87.5 kg, SpO2 96 %. ?Gen:  NAD, pleasant and cooperative ?Cardio:  Normal rate ?Pulm:  No wheezes/rales/rhonchi ?Abd:  Soft, non-distended, non-tender throughout, no rebound/guarding, laparoscopic port sites well-healed ?Ext:  No bilateral LE edema, no bilateral calf tenderness ? ?Disposition:  ? ? ?Allergies as of 03/09/2022   ? ?   Reactions  ? Diclofenac Nausea Only  ? Pt stated, "Upset my stomach." Patient states that she can take NSAIDS, just nothing too strong.  ? Mobic [meloxicam] Palpitations  ? ?  ? ?  ?Medication List  ?  ? ?TAKE these medications   ? ?acetaminophen 500 MG tablet ?Commonly known as: TYLENOL ?Take 2 tablets (1,000 mg total) by mouth every 6 (six) hours. ?What changed:  ?how much to take ?when to take this ?reasons to take this ?  ?atorvastatin 10 MG tablet ?Commonly known as: LIPITOR ?Take 10 mg by mouth daily. ?  ?ergocalciferol 1.25 MG (50000 UT) capsule ?Commonly  known as: VITAMIN D2 ?Take 50,000 Units by mouth once a week. ?  ?hydrochlorothiazide 25 MG tablet ?Commonly known as: HYDRODIURIL ?TK 1 T PO QD IN THE MORNING FOR HIGH BP ?  ?loratadine 10 MG tablet ?Commonly known as: CLARITIN ?Take 10 mg by mouth daily as needed for allergies. ?  ?oxyCODONE 5 MG immediate release tablet ?Commonly known as: Oxy IR/ROXICODONE ?Take 1-2 tablets (5-10 mg total) by mouth every 4 (four) hours as needed for moderate pain. ?  ? ?  ? ? Follow-up Information   ? ? [name], DO Follow up in 2 week(s).   ?Specialty: Obstetrics and Gynecology ?Why: Please keep your 2 week post-operative visit as previously scheduled. ?Contact information: ?[address] ?[address] ?[place] [address] ?[phone] ? ? ?  ?  ? ?  ?  ? ?  ? ? ?Signed: ?[name] ?03/09/2022, 7:31 AM ? ? ?

## 2022-03-08 NOTE — Op Note (Addendum)
Pre Op Dx:   ?1. Endometrial hyperplasia without atypia ?2. Uterine fibroid ? ?Post Op Dx:   ?Same as pre-operative diagnoses ? ?Procedure:   ?Robotic Assisted Total Laparoscopic Hysterectomy with Bilateral Salpingo-oophorectomy ?  ?Surgeon:  Dr. [name] ?Assistants:  Gaylord Shih, RNFA ?Anesthesia:  General ?  ?EBL:  25cc  ?IVF:  1200cc ?UOP:  200cc amber colored urine ?  ?Drains:  Foley catheter ?Specimen removed:  Uterus, cervix, bilateral fallopian tubes and ovaries - sent to pathology ?Device(s) implanted: None ?Case Type:  Clean-contaminated ?Findings:  Normal-appearing uterus with posterior/fundal fibroid present, normal-appearing ovaries bilaterally. Evidence of previous tubal ligation but otherwise normal-appearing fallopian tubes. Normal-appearing liver contours. No pelvic adhesive disease. Bilateral ureters visualized with peristalsis. ?Complications: None ? ?Indications:  71 y.o. postmenopausal G3P3 with endometrial hyperplasia without atypia who desired definitive surgical management. ? ?Description of each procedure:  After informed consent the patient was taken to the operating room and placed in dorsal supine position where general endotracheal anesthesia was administered and found to be adequate.  She was placed in dorsal lithotomy position with her arms tucked.  She was prepped and draped in the usual sterile fashion. A RUMI uterine manipulator with the Koh cup and a Foley catheter were placed.  A timeout was called and the procedure confirmed.   ? ?A 742m supraumbilical incision was made and a trochar was used to enter the abdomen under direct visualization.  Pneumoperitoneum was established and atraumatic entry confirmed. Three additional 537mports were placed on either side of the umbilicus and an 42m54mort was placed in the left upper quadrant under direct visualization. All port sites were injected with 10cc local anesthetic. The pelvis was bathed in a 60cc Ropivicaine solution. ? ?The  patient was placed in Trendelenburg position and the da AT[employer]Tbotic device was docked.  Next, attention was turned to the console where the hysterectomy was performed.  The right infundibulopelvic ligament was divided using electrosurgery. The uteroovarian anastamosis was divided and the right round ligament was divided.  This process was repeated on the contralateral side.  The anterior leaflet of the peritoneum was divided to create a bladder flap. The uterine artery and vein were skeletonized and desiccated superior to the Koh cup.  This process was repeated on the contralateral side.  Uterine blanching was observed.  A circumferential colpotomy was created along the ridge of the Koh cup and the uterus was passed off the field.  The vaginal occluder was placed in the vagina to maintain pneumoperitoneum. ? ?The vaginal cuff was then closed with V-loc suture. Hemostasis confirmed. Arista powder was placed on the vaginal cuff and adnexa bilaterally.  The Da Vinci robotic device was undocked and all ports were visualized.  ? ?The pneumoperitoneum was reduced completely with the assistance of two deep breaths and all ports were removed.  The skin was closed with 4-0 Vicryl in subcuticular fashion with skin glue placed atop each port site.  ? ?The vagina was inspected and there were no vaginal tears noted and no foreign objects remaining in the vagina. A right labium majus abrasion was noted and silver nitrate placed on this site. Foley catheter was removed. The patient was returned to dorsal supine position, awakened and extubated in the OR having appeared to tolerate the procedure well.  All sponge, needle, and instrument counts were correct x 2 at the end of the case. ? ?Disposition:  PACU ? ?MelDrema DallasO ? ?

## 2022-03-08 NOTE — Interval H&P Note (Signed)
History and Physical Interval Note: ? ?03/08/2022 ?7:24 AM ? ?Katrina Evans  has presented today for surgery, with the diagnosis of Simple Endometrial Hyperplasia without Atypia..  The various methods of treatment have been discussed with the patient and family. After consideration of risks, benefits and other options for treatment, the patient has consented to  Procedure(s): ?XI ROBOTIC [place] SALPINGO-OOPHORECTOMY. (Bilateral) as a surgical intervention.  The patient's history has been reviewed, patient examined, no change in status, stable for surgery.  I have reviewed the patient's chart and labs.  Questions were answered to the patient's satisfaction.   ? ? ?[name] ? ? ?

## 2022-03-08 NOTE — Anesthesia Postprocedure Evaluation (Signed)
Anesthesia Post Note ? ?Patient: Katrina Evans ? ?Procedure(s) Performed: XI ROBOTIC ASSISTED LAPAROSCOPIC HYSTERECTOMY AND BILATERAL SALPINGO-OOPHORECTOMY. (Bilateral: Abdomen) ? ?  ? ?Patient location during evaluation: PACU ?Anesthesia Type: General ?Level of consciousness: awake ?Pain management: pain level controlled ?Vital Signs Assessment: post-procedure vital signs reviewed and stable ?Respiratory status: spontaneous breathing, nonlabored ventilation, respiratory function stable and patient connected to nasal cannula oxygen ?Cardiovascular status: blood pressure returned to baseline and stable ?Postop Assessment: no apparent nausea or vomiting ?Anesthetic complications: no ? ? ?No notable events documented. ? ?Last Vitals:  ?Vitals:  ? 03/08/22 1618 03/08/22 1932  ?BP: 133/75 (!) 147/61  ?Pulse: 77 80  ?Resp: 18 18  ?Temp: 36.8 ?C 36.7 ?C  ?SpO2: 97% 96%  ?  ?Last Pain:  ?Vitals:  ? 03/08/22 1932  ?TempSrc: Oral  ?PainSc: 3   ? ? ?  ?  ?  ?  ?  ?  ? ?Charl Wellen P Zymiere Trostle ? ? ? ? ?

## 2022-03-08 NOTE — Transfer of Care (Signed)
Immediate Anesthesia Transfer of Care Note ? ?Patient: Katrina Evans ? ?Procedure(s) Performed: Procedure(s) (LRB): ?XI ROBOTIC ASSISTED LAPAROSCOPIC HYSTERECTOMY AND BILATERAL SALPINGO-OOPHORECTOMY. (Bilateral) ? ?Patient Location: PACU ? ?Anesthesia Type: General ? ?Level of Consciousness: awake, sedated, patient cooperative and responds to stimulation ? ?Airway & Oxygen Therapy: Patient Spontanous Breathing and Patient connected to [place] oxygen ? ?Post-op Assessment: Report given to PACU RN, Post -op Vital signs reviewed and stable and Patient moving all extremities ? ?Post vital signs: Reviewed and stable ? ?Complications: No apparent anesthesia complications ?

## 2022-03-08 NOTE — Anesthesia Preprocedure Evaluation (Addendum)
Anesthesia Evaluation  ?Patient identified by MRN, date of birth, ID band ?Patient awake ? ? ? ?Reviewed: ?Allergy & Precautions, NPO status , Patient's Chart, lab work & pertinent test results ? ?Airway ?Mallampati: III ? ?TM Distance: >3 FB ?Neck ROM: Full ? ? ? Dental ?no notable dental hx. ? ?  ?Pulmonary ?former smoker,  ?  ?Pulmonary exam normal ? ? ? ? ? ? ? Cardiovascular ?hypertension, Pt. on medications ?Normal cardiovascular exam ? ? ?  ?Neuro/Psych ?negative neurological ROS ? negative psych ROS  ? GI/Hepatic ?Neg liver ROS, GERD  Controlled,  ?Endo/Other  ?Pre-DM ? Renal/GU ?negative Renal ROS  ? ?  ?Musculoskeletal ?negative musculoskeletal ROS ?(+)  ? Abdominal ?(+) + obese,   ?Peds ? Hematology ?negative hematology ROS ?(+)   ?Anesthesia Other Findings ?Simple Endometrial Hyperplasia without Atypia ? Reproductive/Obstetrics ? ?  ? ? ? ? ? ? ? ? ? ? ? ? ? ?  ?  ? ? ? ? ? ? ? ?Anesthesia Physical ?Anesthesia Plan ? ?ASA: 2 ? ?Anesthesia Plan: General  ? ?Post-op Pain Management:   ? ?Induction: Intravenous ? ?PONV Risk Score and Plan: 4 or greater and Ondansetron, Dexamethasone, Propofol infusion and Treatment may vary due to age or medical condition ? ?Airway Management Planned: Oral ETT ? ?Additional Equipment:  ? ?Intra-op Plan:  ? ?Post-operative Plan: Extubation in OR ? ?Informed Consent: I have reviewed the patients History and Physical, chart, labs and discussed the procedure including the risks, benefits and alternatives for the proposed anesthesia with the patient or authorized representative who has indicated his/her understanding and acceptance.  ? ? ? ?Dental advisory given ? ?Plan Discussed with: CRNA ? ?Anesthesia Plan Comments:   ? ? ? ? ? ?Anesthesia Quick Evaluation ? ?

## 2022-03-09 ENCOUNTER — Encounter (HOSPITAL_BASED_OUTPATIENT_CLINIC_OR_DEPARTMENT_OTHER): Payer: Self-pay | Admitting: Obstetrics and Gynecology

## 2022-03-09 DIAGNOSIS — Z6834 Body mass index (BMI) 34.0-34.9, adult: Secondary | ICD-10-CM | POA: Diagnosis not present

## 2022-03-09 DIAGNOSIS — E669 Obesity, unspecified: Secondary | ICD-10-CM | POA: Diagnosis not present

## 2022-03-09 DIAGNOSIS — I10 Essential (primary) hypertension: Secondary | ICD-10-CM | POA: Diagnosis not present

## 2022-03-09 DIAGNOSIS — N8003 Adenomyosis of the uterus: Secondary | ICD-10-CM | POA: Diagnosis not present

## 2022-03-09 DIAGNOSIS — Z87891 Personal history of nicotine dependence: Secondary | ICD-10-CM | POA: Diagnosis not present

## 2022-03-09 DIAGNOSIS — Z8249 Family history of ischemic heart disease and other diseases of the circulatory system: Secondary | ICD-10-CM | POA: Diagnosis not present

## 2022-03-09 DIAGNOSIS — R7303 Prediabetes: Secondary | ICD-10-CM | POA: Diagnosis not present

## 2022-03-09 DIAGNOSIS — Z780 Asymptomatic menopausal state: Secondary | ICD-10-CM | POA: Diagnosis not present

## 2022-03-09 DIAGNOSIS — K219 Gastro-esophageal reflux disease without esophagitis: Secondary | ICD-10-CM | POA: Diagnosis not present

## 2022-03-09 LAB — SURGICAL PATHOLOGY

## 2022-03-09 MED ORDER — ACETAMINOPHEN 500 MG PO TABS
ORAL_TABLET | ORAL | Status: AC
  Filled 2022-03-09: qty 2

## 2022-03-09 MED ORDER — OXYCODONE HCL 5 MG PO TABS
ORAL_TABLET | ORAL | Status: AC
  Filled 2022-03-09: qty 1

## 2022-04-12 ENCOUNTER — Other Ambulatory Visit: Payer: Self-pay | Admitting: Physician Assistant

## 2022-04-12 DIAGNOSIS — Z1231 Encounter for screening mammogram for malignant neoplasm of breast: Secondary | ICD-10-CM

## 2022-04-15 ENCOUNTER — Inpatient Hospital Stay (HOSPITAL_BASED_OUTPATIENT_CLINIC_OR_DEPARTMENT_OTHER): Admission: RE | Admit: 2022-04-15 | Payer: Medicare HMO | Source: Ambulatory Visit | Admitting: Radiology

## 2022-04-15 ENCOUNTER — Ambulatory Visit (HOSPITAL_BASED_OUTPATIENT_CLINIC_OR_DEPARTMENT_OTHER): Payer: Medicare HMO | Admitting: Orthopaedic Surgery

## 2022-04-15 DIAGNOSIS — M25561 Pain in right knee: Secondary | ICD-10-CM

## 2022-04-15 DIAGNOSIS — M174 Other bilateral secondary osteoarthritis of knee: Secondary | ICD-10-CM | POA: Diagnosis not present

## 2022-04-15 DIAGNOSIS — Z1231 Encounter for screening mammogram for malignant neoplasm of breast: Secondary | ICD-10-CM

## 2022-04-15 DIAGNOSIS — M25562 Pain in left knee: Secondary | ICD-10-CM | POA: Diagnosis not present

## 2022-04-15 MED ORDER — TRIAMCINOLONE ACETONIDE 40 MG/ML IJ SUSP
80.0000 mg | INTRAMUSCULAR | Status: AC | PRN
  Administered 2022-04-15: 80 mg via INTRA_ARTICULAR

## 2022-04-15 MED ORDER — LIDOCAINE HCL 1 % IJ SOLN
4.0000 mL | INTRAMUSCULAR | Status: AC | PRN
  Administered 2022-04-15: 4 mL

## 2022-04-16 ENCOUNTER — Ambulatory Visit (HOSPITAL_BASED_OUTPATIENT_CLINIC_OR_DEPARTMENT_OTHER)
Admission: RE | Admit: 2022-04-16 | Discharge: 2022-04-16 | Disposition: A | Discharge status: Home / Self Care | Payer: Medicare HMO | Source: Ambulatory Visit | Attending: Physician Assistant | Admitting: Physician Assistant

## 2022-04-16 DIAGNOSIS — Z1231 Encounter for screening mammogram for malignant neoplasm of breast: Secondary | ICD-10-CM

## 2022-04-19 ENCOUNTER — Other Ambulatory Visit: Payer: Self-pay | Admitting: Physician Assistant

## 2022-04-19 DIAGNOSIS — R928 Other abnormal and inconclusive findings on diagnostic imaging of breast: Secondary | ICD-10-CM

## 2022-04-25 DIAGNOSIS — R3989 Other symptoms and signs involving the genitourinary system: Secondary | ICD-10-CM | POA: Diagnosis not present

## 2022-04-25 DIAGNOSIS — R58 Hemorrhage, not elsewhere classified: Secondary | ICD-10-CM | POA: Diagnosis not present

## 2022-04-27 ENCOUNTER — Other Ambulatory Visit: Payer: Self-pay | Admitting: Physician Assistant

## 2022-04-27 ENCOUNTER — Ambulatory Visit
Admission: RE | Admit: 2022-04-27 | Discharge: 2022-04-27 | Disposition: A | Discharge status: Home / Self Care | Payer: Medicare HMO | Source: Ambulatory Visit | Attending: Physician Assistant | Admitting: Physician Assistant

## 2022-04-27 DIAGNOSIS — N6313 Unspecified lump in the right breast, lower outer quadrant: Secondary | ICD-10-CM | POA: Diagnosis not present

## 2022-04-27 DIAGNOSIS — N6310 Unspecified lump in the right breast, unspecified quadrant: Secondary | ICD-10-CM

## 2022-04-27 DIAGNOSIS — N6311 Unspecified lump in the right breast, upper outer quadrant: Secondary | ICD-10-CM | POA: Diagnosis not present

## 2022-04-27 DIAGNOSIS — R928 Other abnormal and inconclusive findings on diagnostic imaging of breast: Secondary | ICD-10-CM

## 2022-05-02 ENCOUNTER — Other Ambulatory Visit: Payer: Medicare HMO

## 2022-05-03 ENCOUNTER — Other Ambulatory Visit: Payer: Medicare HMO

## 2022-05-16 ENCOUNTER — Emergency Department (HOSPITAL_BASED_OUTPATIENT_CLINIC_OR_DEPARTMENT_OTHER)
Admission: EM | Admit: 2022-05-16 | Discharge: 2022-05-16 | Disposition: A | Discharge status: Home / Self Care | Payer: Medicare HMO | Attending: Emergency Medicine | Admitting: Emergency Medicine

## 2022-05-16 ENCOUNTER — Encounter (HOSPITAL_BASED_OUTPATIENT_CLINIC_OR_DEPARTMENT_OTHER): Payer: Self-pay | Admitting: Emergency Medicine

## 2022-05-16 ENCOUNTER — Other Ambulatory Visit: Payer: Self-pay

## 2022-05-16 DIAGNOSIS — Z85820 Personal history of malignant melanoma of skin: Secondary | ICD-10-CM | POA: Insufficient documentation

## 2022-05-16 DIAGNOSIS — R220 Localized swelling, mass and lump, head: Secondary | ICD-10-CM | POA: Diagnosis present

## 2022-05-16 DIAGNOSIS — I10 Essential (primary) hypertension: Secondary | ICD-10-CM | POA: Diagnosis not present

## 2022-05-16 DIAGNOSIS — T783XXA Angioneurotic edema, initial encounter: Secondary | ICD-10-CM | POA: Insufficient documentation

## 2022-05-16 MED ORDER — FAMOTIDINE IN NACL 20-0.9 MG/50ML-% IV SOLN
20.0000 mg | Freq: Once | INTRAVENOUS | Status: AC
  Administered 2022-05-16: 20 mg via INTRAVENOUS
  Filled 2022-05-16: qty 50

## 2022-05-16 MED ORDER — DIPHENHYDRAMINE HCL 50 MG/ML IJ SOLN
12.5000 mg | Freq: Once | INTRAMUSCULAR | Status: AC
  Administered 2022-05-16: 12.5 mg via INTRAVENOUS
  Filled 2022-05-16: qty 1

## 2022-05-16 MED ORDER — PREDNISONE 20 MG PO TABS: 40.0000 mg | ORAL_TABLET | Freq: Every day | ORAL | 0 refills | Status: DC

## 2022-05-16 MED ORDER — METHYLPREDNISOLONE SODIUM SUCC 125 MG IJ SOLR
125.0000 mg | Freq: Once | INTRAMUSCULAR | Status: AC
  Administered 2022-05-16: 125 mg via INTRAVENOUS
  Filled 2022-05-16: qty 2

## 2022-05-16 NOTE — ED Provider Notes (Addendum)
[facility] EMERGENCY DEPT Provider Note   CSN: [id number] Arrival date & time: 05/16/22  0440     History  Chief Complaint  Patient presents with   Allergic Reaction    Katrina Evans is a 71 y.o. female.  HPI     This is a 71 year old female who presents with lip swelling.  Patient reports onset of symptoms around 3 AM this morning.  She noted swelling of the left side of her face and left lower lip.  She states that this is happened multiple times in the last 6 months but to a lesser extent.  She has never had to medicate herself.  She took Benadryl prior to arrival.  She was concerned this morning because the swelling seem to be worse.  She denies any new medications or foods.  She denies being on an ACE inhibitor.  She has no family history of similar problems.  Home Medications Prior to Admission medications   Medication Sig Start Date End Date Taking? Authorizing Provider  predniSONE (DELTASONE) 20 MG tablet Take 2 tablets (40 mg total) by mouth daily. 05/16/22  Yes Fia Hebert, Barbette Hair, MD  acetaminophen (TYLENOL) 500 MG tablet Take 2 tablets (1,000 mg total) by mouth every 6 (six) hours. 03/08/22   [name], DO  atorvastatin (LIPITOR) 10 MG tablet Take 10 mg by mouth daily.    [provider]  ergocalciferol (VITAMIN D2) 1.25 MG (50000 UT) capsule Take 50,000 Units by mouth once a week.    [provider]  hydrochlorothiazide (HYDRODIURIL) 25 MG tablet TK 1 T PO QD IN THE MORNING FOR HIGH BP 06/24/19   [provider]  loratadine (CLARITIN) 10 MG tablet Take 10 mg by mouth daily as needed for allergies.    [provider]  oxyCODONE (OXY IR/ROXICODONE) 5 MG immediate release tablet Take 1-2 tablets (5-10 mg total) by mouth every 4 (four) hours as needed for moderate pain. 03/08/22   [name], DO      Allergies    Diclofenac and Mobic [meloxicam]    Review of Systems   Review of Systems  HENT:  Positive for  facial swelling.   All other systems reviewed and are negative.   Physical Exam Updated Vital Signs BP 126/69   Pulse 68   Temp 98.1 F (36.7 C) (Oral)   Resp 16   Ht 1.575 m ('5\' 2"'$ )   Wt 87.1 kg   SpO2 96%   BMI 35.12 kg/m  Physical Exam Vitals and nursing note reviewed.  Constitutional:      Appearance: She is well-developed.     Comments: Overweight, ABCs intact  HENT:     Head: Normocephalic and atraumatic.     Mouth/Throat:     Comments: Swelling noted most prominently of the left lower lip, slight swelling noted of the left face, no tongue or oropharyngeal swelling Eyes:     Pupils: Pupils are equal, round, and reactive to light.  Cardiovascular:     Rate and Rhythm: Normal rate and regular rhythm.     Heart sounds: Normal heart sounds.  Pulmonary:     Effort: Pulmonary effort is normal. No respiratory distress.     Breath sounds: No wheezing.  Abdominal:     Palpations: Abdomen is soft.  Musculoskeletal:     Cervical back: Neck supple.  Skin:    General: Skin is warm and dry.  Neurological:     Mental Status: She is alert and oriented to person,  place, and time.  Psychiatric:        Mood and Affect: Mood normal.     ED Results / Procedures / Treatments   Labs (all labs ordered are listed, but only abnormal results are displayed) Labs Reviewed - No data to display  EKG None  Radiology No results found.  Procedures Procedures    Medications Ordered in ED Medications  famotidine (PEPCID) IVPB 20 mg premix (0 mg Intravenous Stopped 05/16/22 0541)  methylPREDNISolone sodium succinate (SOLU-MEDROL) 125 mg/2 mL injection 125 mg (125 mg Intravenous Given 05/16/22 0513)  diphenhydrAMINE (BENADRYL) injection 12.5 mg (12.5 mg Intravenous Given 05/16/22 2010)    ED Course/ Medical Decision Making/ A&P Clinical Course as of 05/16/22 0651  Mon May 16, 2022  0557 On recheck, no improvement.  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  Do not have an obvious  culprit.  Patient states she feels like it is actually getting worse.  We will continue to monitor. [CH]  0651 Improved.  Will discharge with prednisone and allergy and immunology referral. [CH]    Clinical Course User Index [CH] Akito Boomhower, Barbette Hair, MD                           Medical Decision Making Risk Prescription drug management.   This patient presents to the ED for concern of lip swelling, this involves an extensive number of treatment options, and is a complaint that carries with it a high risk of complications and morbidity.  I considered the following differential and admission for this acute, potentially life threatening condition.  The differential diagnosis includes allergic reaction, angioedema -no allergic versus hereditary  MDM:    This is a 71 year old female who presents with isolated lip swelling.  She is nontoxic and vital signs are reassuring.  ABCs are intact.  Swelling seems isolated to the left lower lip left face.  No oropharyngeal swelling noted.  She is not on any ACE inhibitors that I can see.  No obvious culprit.  Reports multiple episodes of similar, less severe symptoms within the last 6 months.  Question nonallergic angioedema versus potential hereditary angioedema.  She was given Pepcid and Solu-Medrol.  No indication for epinephrine as I have low suspicion for allergic angioedema.  See clinical course above.  (Labs, imaging, consults)  Labs: I Ordered, and personally interpreted labs.  The pertinent results include: None  Imaging Studies ordered: I ordered imaging studies including none I independently visualized and interpreted imaging. I agree with the radiologist interpretation  Additional history obtained from chart review.  External records from outside source obtained and reviewed including prior evaluations  Cardiac Monitoring: The patient was maintained on a cardiac monitor.  I personally viewed and interpreted the cardiac monitored which  showed an underlying rhythm of: Normal sinus rhythm  Reevaluation: After the interventions noted above, I reevaluated the patient and found that they have :stayed the same  Social Determinants of Health: Lives independently  Disposition: Pending  Co morbidities that complicate the patient evaluation  Past Medical History:  Diagnosis Date   Cancer ([place])    history of melanoma, bottom or right foot, around 2020   Edema of both lower extremities    GERD (gastroesophageal reflux disease)    High cholesterol    Hypertension    Joint pain    Obesity    Pre-diabetes    SOB (shortness of breath)    Vitamin D deficiency      Medicines  Meds ordered this encounter  Medications   famotidine (PEPCID) IVPB 20 mg premix   methylPREDNISolone sodium succinate (SOLU-MEDROL) 125 mg/2 mL injection 125 mg    IV methylprednisolone will be converted to either a q12h or q24h frequency with the same total daily dose (TDD).  Ordered Dose: 1 to 125 mg TDD; convert to: TDD q24h.  Ordered Dose: 126 to 250 mg TDD; convert to: TDD div q12h.  Ordered Dose: >250 mg TDD; DAW.   diphenhydrAMINE (BENADRYL) injection 12.5 mg   predniSONE (DELTASONE) 20 MG tablet    Sig: Take 2 tablets (40 mg total) by mouth daily.    Dispense:  8 tablet    Refill:  0    I have reviewed the patients home medicines and have made adjustments as needed  Problem List / ED Course: Problem List Items Addressed This Visit   None Visit Diagnoses     Angioedema of lips, initial encounter    -  Primary                   Final Clinical Impression(s) / ED Diagnoses Final diagnoses:  Angioedema of lips, initial encounter    Rx / DC Orders ED Discharge Orders          Ordered    predniSONE (DELTASONE) 20 MG tablet  Daily        05/16/22 0649              Merryl Hacker, MD 05/16/22 5830    Merryl Hacker, MD 05/16/22 [phone]

## 2022-05-16 NOTE — ED Triage Notes (Signed)
  Patient comes in with allergic reaction that she noticed when she woke up around 0300.  Patient states this has happened around 5 times in the last 6 months and they do not know what is causing the reaction.  Patient states she has never needed an epi pen for symptoms.  No respiratory involvement at this time.  Swelling predominantly on L cheek and bottom lip.  No pain but feels tight.  Took 25 mg benadryl around 0330.

## 2022-05-16 NOTE — Discharge Instructions (Signed)
You were seen today for swelling of your lips.  Continue prednisone at home for the next 4 days.  You need to follow-up with allergy and immunology.  If you develop difficulty breathing or other symptoms, you should be reevaluated.

## 2022-05-31 DIAGNOSIS — H2513 Age-related nuclear cataract, bilateral: Secondary | ICD-10-CM | POA: Diagnosis not present

## 2022-06-01 ENCOUNTER — Encounter (INDEPENDENT_AMBULATORY_CARE_PROVIDER_SITE_OTHER): Payer: Self-pay

## 2022-06-08 ENCOUNTER — Ambulatory Visit
Admission: RE | Admit: 2022-06-08 | Discharge: 2022-06-08 | Disposition: A | Discharge status: Home / Self Care | Payer: Medicare HMO | Source: Ambulatory Visit | Attending: Physician Assistant | Admitting: Physician Assistant

## 2022-06-08 DIAGNOSIS — N6011 Diffuse cystic mastopathy of right breast: Secondary | ICD-10-CM | POA: Diagnosis not present

## 2022-06-08 DIAGNOSIS — N6311 Unspecified lump in the right breast, upper outer quadrant: Secondary | ICD-10-CM | POA: Diagnosis not present

## 2022-06-08 DIAGNOSIS — N6310 Unspecified lump in the right breast, unspecified quadrant: Secondary | ICD-10-CM

## 2022-06-08 HISTORY — PX: BREAST BIOPSY: SHX20

## 2022-06-09 ENCOUNTER — Other Ambulatory Visit: Payer: Medicare HMO

## 2022-07-07 DIAGNOSIS — R7303 Prediabetes: Secondary | ICD-10-CM | POA: Diagnosis not present

## 2022-07-07 DIAGNOSIS — M1990 Unspecified osteoarthritis, unspecified site: Secondary | ICD-10-CM | POA: Diagnosis not present

## 2022-07-07 DIAGNOSIS — Z0000 Encounter for general adult medical examination without abnormal findings: Secondary | ICD-10-CM | POA: Diagnosis not present

## 2022-07-07 DIAGNOSIS — I872 Venous insufficiency (chronic) (peripheral): Secondary | ICD-10-CM | POA: Diagnosis not present

## 2022-07-07 DIAGNOSIS — Z85820 Personal history of malignant melanoma of skin: Secondary | ICD-10-CM | POA: Diagnosis not present

## 2022-07-07 DIAGNOSIS — E7800 Pure hypercholesterolemia, unspecified: Secondary | ICD-10-CM | POA: Diagnosis not present

## 2022-07-07 DIAGNOSIS — K219 Gastro-esophageal reflux disease without esophagitis: Secondary | ICD-10-CM | POA: Diagnosis not present

## 2022-07-07 DIAGNOSIS — M8580 Other specified disorders of bone density and structure, unspecified site: Secondary | ICD-10-CM | POA: Diagnosis not present

## 2022-07-07 DIAGNOSIS — I10 Essential (primary) hypertension: Secondary | ICD-10-CM | POA: Diagnosis not present

## 2022-07-13 DIAGNOSIS — H269 Unspecified cataract: Secondary | ICD-10-CM | POA: Diagnosis not present

## 2022-07-13 DIAGNOSIS — H2511 Age-related nuclear cataract, right eye: Secondary | ICD-10-CM | POA: Diagnosis not present

## 2022-07-30 DIAGNOSIS — T7840XA Allergy, unspecified, initial encounter: Secondary | ICD-10-CM | POA: Diagnosis not present

## 2022-08-03 DIAGNOSIS — H269 Unspecified cataract: Secondary | ICD-10-CM | POA: Diagnosis not present

## 2022-08-03 DIAGNOSIS — H2512 Age-related nuclear cataract, left eye: Secondary | ICD-10-CM | POA: Diagnosis not present

## 2022-08-19 ENCOUNTER — Ambulatory Visit (HOSPITAL_BASED_OUTPATIENT_CLINIC_OR_DEPARTMENT_OTHER): Payer: Medicare HMO | Admitting: Orthopaedic Surgery

## 2022-08-24 ENCOUNTER — Ambulatory Visit (INDEPENDENT_AMBULATORY_CARE_PROVIDER_SITE_OTHER): Payer: Medicare HMO | Admitting: Orthopaedic Surgery

## 2022-08-24 DIAGNOSIS — M25562 Pain in left knee: Secondary | ICD-10-CM

## 2022-08-24 DIAGNOSIS — M25561 Pain in right knee: Secondary | ICD-10-CM | POA: Diagnosis not present

## 2022-08-24 MED ORDER — TRIAMCINOLONE ACETONIDE 40 MG/ML IJ SUSP
80.0000 mg | INTRAMUSCULAR | Status: AC | PRN
  Administered 2022-08-24: 80 mg via INTRA_ARTICULAR

## 2022-08-24 MED ORDER — LIDOCAINE HCL 1 % IJ SOLN
4.0000 mL | INTRAMUSCULAR | Status: AC | PRN
  Administered 2022-08-24: 4 mL

## 2022-08-24 NOTE — Progress Notes (Signed)
Chief Complaint: Lower back and right knee pain     History of Present Illness:   08/24/2022: Presents today for follow-up of both knees.  Approximately 3 months of relief from her last injection.  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  That effect she has not been able to pursue right knee arthroplasty.  She is interested in a right knee injection.  Katrina Evans is a 71 y.o. female presents with lower back pain going on for 1 month as well as right knee pain which has been very severe for several years although has progressed in the most recent weeks.  She states that she feels pain with going up and down stairs.  She does experience popping and crepitus.  She is taking ibuprofen which helps for the right knee.  She has previously had a series of steroid injections up to 3 times the previous year which more recently only gave a couple weeks of relief.  She has had the gel shot injections previously with minimal relief from these.  She continues to have pain in the patellofemoral area particular with stairs    Surgical History:   None  PMH/PSH/Family History/Social History/Meds/Allergies:    Past Medical History:  Diagnosis Date   Cancer ([place])    history of melanoma, bottom or right foot, around 2020   Edema of both lower extremities    GERD (gastroesophageal reflux disease)    High cholesterol    Hypertension    Joint pain    Obesity    Pre-diabetes    SOB (shortness of breath)    Vitamin D deficiency    Past Surgical History:  Procedure Laterality Date   BREAST BIOPSY Right    No Scar, around [place] N/A 08/26/2021   Procedure: [place];  Surgeon: [name], DO;  Location: [facility];  Service: Gynecology;  Laterality: N/A;   LAPAROSCOPIC APPENDECTOMY N/A 08/17/2017   Procedure: APPENDECTOMY LAPAROSCOPIC;  Surgeon: Excell Seltzer, MD;  Location: [facility];  Service: General;  Laterality: N/A;   laser vein surgery     many years ago per pt as of 03/02/22   ROBOTIC ASSISTED LAPAROSCOPIC HYSTERECTOMY AND SALPINGECTOMY Bilateral 03/08/2022   Procedure: XI ROBOTIC ASSISTED LAPAROSCOPIC HYSTERECTOMY AND BILATERAL SALPINGO-OOPHORECTOMY.;  Surgeon: [name], DO;  Location: [facility];  Service: Gynecology;  Laterality: Bilateral;   TUBAL LIGATION  1983   Social History   Socioeconomic History   Marital status: Divorced    Spouse name: Not on file   Number of children: Not on file   Years of education: Not on file   Highest education level: Not on file  Occupational History   Occupation: Freight forwarder of Office Building  Tobacco Use   Smoking status: Former    Packs/day: 1.00    Years: 35.00    Total pack years: 35.00    Types: Cigarettes    Quit date: 2005    Years since quitting: 18.8   Smokeless tobacco: Never  Vaping Use   Vaping Use: Never used  Substance and Sexual Activity   Alcohol use: Yes    Comment: a drink twice a year   Drug use: No   Sexual activity: Not Currently    Birth control/protection: Surgical  Other Topics Concern   Not on file  Social History Narrative   Not on file   Social Determinants of Health   Financial Resource Strain: Not on file  Food Insecurity: Not on file  Transportation Needs: Not on file  Physical Activity: Not on file  Stress: Not on file  Social Connections: Not on file   Family History  Problem Relation Age of Onset   Diabetes Mother    High blood pressure Mother    High Cholesterol Mother    Stroke Mother    Cancer Mother    Depression Mother    Schizophrenia Mother    Obesity Mother    High blood pressure Father    High Cholesterol Father    Heart disease Father    Cancer Father    Alcoholism Father    Breast cancer Neg Hx    Allergies  Allergen Reactions   Diclofenac Nausea Only    Pt stated, "Upset my stomach." Patient  states that she can take NSAIDS, just nothing too strong.   Mobic [Meloxicam] Palpitations   Current Outpatient Medications  Medication Sig Dispense Refill   acetaminophen (TYLENOL) 500 MG tablet Take 2 tablets (1,000 mg total) by mouth every 6 (six) hours. 120 tablet 0   atorvastatin (LIPITOR) 10 MG tablet Take 10 mg by mouth daily.     ergocalciferol (VITAMIN D2) 1.25 MG (50000 UT) capsule Take 50,000 Units by mouth once a week.     hydrochlorothiazide (HYDRODIURIL) 25 MG tablet TK 1 T PO QD IN THE MORNING FOR HIGH BP     loratadine (CLARITIN) 10 MG tablet Take 10 mg by mouth daily as needed for allergies.     oxyCODONE (OXY IR/ROXICODONE) 5 MG immediate release tablet Take 1-2 tablets (5-10 mg total) by mouth every 4 (four) hours as needed for moderate pain. 20 tablet 0   predniSONE (DELTASONE) 20 MG tablet Take 2 tablets (40 mg total) by mouth daily. 8 tablet 0   No current facility-administered medications for this visit.   No results found.  Review of Systems:   A ROS was performed including pertinent positives and negatives as documented in the HPI.  Physical Exam :   Constitutional: NAD and appears stated age Neurological: Alert and oriented Psych: Appropriate affect and cooperative There were no vitals taken for this visit.   Comprehensive Musculoskeletal Exam:    There is bilateral patellofemoral crepitus.  Range of motion right knee is from 0 to 130 degrees with some pain.  No joint line tenderness.  Negative Lachman.  Negative McMurray bilaterally.  Sensation is intact in all descriptions of the right knee.  There is moderate effusion of the right knee  Imaging:   Xray (4 views right knee, 4 views left knee): Bilateral tricompartmental moderate osteoarthritis most significantly involving the patellofemoral joint    I personally reviewed and interpreted the radiographs.   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  At this time she is elected for right knee ultrasound-guided injection. Plan :    -Right knee ultrasound-guided injection performed after verbal consent obtained -Follow-up as needed should she want additional injection versus surgical discussion    Procedure Note  Patient: Katrina Evans             Date of Birth: June 25, 1951           MRN: [id number]  Visit Date: 08/24/2022  Procedures: Visit Diagnoses:  No diagnosis found.   Large Joint Inj: R knee on 08/24/2022 9:30 AM Indications: pain Details: 22 G 1.5 in needle, ultrasound-guided anterior approach  Arthrogram: No  Medications: 4 mL lidocaine 1 %; 80 mg triamcinolone acetonide 40 MG/ML Outcome: tolerated well, no immediate complications Procedure, treatment alternatives, risks and benefits explained, specific risks discussed. Consent was given by the patient. Immediately prior to procedure a time out was called to verify the correct patient, procedure, equipment, support staff and site/side marked as required. Patient was prepped and draped in the usual sterile fashion.        I personally saw and evaluated the patient, and participated in the management and treatment plan.  Vanetta Mulders, MD Attending Physician, Orthopedic Surgery  This document was dictated using Dragon voice recognition software. A reasonable attempt at proof reading has been made to minimize errors.

## 2022-08-29 DIAGNOSIS — T783XXD Angioneurotic edema, subsequent encounter: Secondary | ICD-10-CM | POA: Diagnosis not present

## 2022-08-29 DIAGNOSIS — J3089 Other allergic rhinitis: Secondary | ICD-10-CM | POA: Diagnosis not present

## 2022-08-29 DIAGNOSIS — J300 Vasomotor rhinitis: Secondary | ICD-10-CM | POA: Diagnosis not present

## 2022-09-07 DIAGNOSIS — G4719 Other hypersomnia: Secondary | ICD-10-CM | POA: Diagnosis not present

## 2022-10-19 DIAGNOSIS — G4733 Obstructive sleep apnea (adult) (pediatric): Secondary | ICD-10-CM | POA: Diagnosis not present

## 2022-10-20 DIAGNOSIS — G4733 Obstructive sleep apnea (adult) (pediatric): Secondary | ICD-10-CM | POA: Diagnosis not present

## 2022-10-20 DIAGNOSIS — I10 Essential (primary) hypertension: Secondary | ICD-10-CM | POA: Diagnosis not present

## 2022-11-24 DIAGNOSIS — Z0100 Encounter for examination of eyes and vision without abnormal findings: Secondary | ICD-10-CM | POA: Diagnosis not present

## 2022-11-29 DIAGNOSIS — R3915 Urgency of urination: Secondary | ICD-10-CM | POA: Diagnosis not present

## 2022-11-29 DIAGNOSIS — R002 Palpitations: Secondary | ICD-10-CM | POA: Diagnosis not present

## 2022-11-29 DIAGNOSIS — I10 Essential (primary) hypertension: Secondary | ICD-10-CM | POA: Diagnosis not present

## 2022-11-29 DIAGNOSIS — R0789 Other chest pain: Secondary | ICD-10-CM | POA: Diagnosis not present

## 2022-11-29 DIAGNOSIS — F411 Generalized anxiety disorder: Secondary | ICD-10-CM | POA: Diagnosis not present

## 2022-11-29 DIAGNOSIS — E7800 Pure hypercholesterolemia, unspecified: Secondary | ICD-10-CM | POA: Diagnosis not present

## 2022-11-29 DIAGNOSIS — T783XXA Angioneurotic edema, initial encounter: Secondary | ICD-10-CM | POA: Diagnosis not present

## 2022-12-07 ENCOUNTER — Emergency Department (HOSPITAL_BASED_OUTPATIENT_CLINIC_OR_DEPARTMENT_OTHER)
Admission: EM | Admit: 2022-12-07 | Discharge: 2022-12-07 | Disposition: A | Discharge status: Home / Self Care | Payer: Medicare HMO | Attending: Emergency Medicine | Admitting: Emergency Medicine

## 2022-12-07 ENCOUNTER — Other Ambulatory Visit (HOSPITAL_BASED_OUTPATIENT_CLINIC_OR_DEPARTMENT_OTHER): Payer: Self-pay

## 2022-12-07 ENCOUNTER — Encounter (HOSPITAL_BASED_OUTPATIENT_CLINIC_OR_DEPARTMENT_OTHER): Payer: Self-pay

## 2022-12-07 ENCOUNTER — Other Ambulatory Visit: Payer: Self-pay

## 2022-12-07 DIAGNOSIS — T783XXA Angioneurotic edema, initial encounter: Secondary | ICD-10-CM | POA: Diagnosis not present

## 2022-12-07 DIAGNOSIS — J300 Vasomotor rhinitis: Secondary | ICD-10-CM | POA: Diagnosis not present

## 2022-12-07 DIAGNOSIS — R220 Localized swelling, mass and lump, head: Secondary | ICD-10-CM | POA: Diagnosis present

## 2022-12-07 DIAGNOSIS — T783XXD Angioneurotic edema, subsequent encounter: Secondary | ICD-10-CM | POA: Diagnosis not present

## 2022-12-07 LAB — CBC WITH DIFFERENTIAL/PLATELET
Abs Immature Granulocytes: 0.01 10*3/uL (ref 0.00–0.07)
Basophils Absolute: 0.1 10*3/uL (ref 0.0–0.1)
Basophils Relative: 1 %
Eosinophils Absolute: 0.2 10*3/uL (ref 0.0–0.5)
Eosinophils Relative: 3 %
HCT: 43.8 % (ref 36.0–46.0)
Hemoglobin: 14.7 g/dL (ref 12.0–15.0)
Immature Granulocytes: 0 %
Lymphocytes Relative: 41 %
Lymphs Abs: 2.7 10*3/uL (ref 0.7–4.0)
MCH: 31.8 pg (ref 26.0–34.0)
MCHC: 33.6 g/dL (ref 30.0–36.0)
MCV: 94.8 fL (ref 80.0–100.0)
Monocytes Absolute: 0.8 10*3/uL (ref 0.1–1.0)
Monocytes Relative: 11 %
Neutro Abs: 2.9 10*3/uL (ref 1.7–7.7)
Neutrophils Relative %: 44 %
Platelets: 203 10*3/uL (ref 150–400)
RBC: 4.62 MIL/uL (ref 3.87–5.11)
RDW: 13.1 % (ref 11.5–15.5)
WBC: 6.6 10*3/uL (ref 4.0–10.5)
nRBC: 0 % (ref 0.0–0.2)

## 2022-12-07 LAB — BASIC METABOLIC PANEL
Anion gap: 9 (ref 5–15)
BUN: 27 mg/dL — ABNORMAL HIGH (ref 8–23)
CO2: 28 mmol/L (ref 22–32)
Calcium: 9.9 mg/dL (ref 8.9–10.3)
Chloride: 104 mmol/L (ref 98–111)
Creatinine, Ser: 0.83 mg/dL (ref 0.44–1.00)
GFR, Estimated: >60 mL/min (ref >60)
Glucose, Bld: 93 mg/dL (ref 70–99)
Potassium: 3.5 mmol/L (ref 3.5–5.1)
Sodium: 141 mmol/L (ref 135–145)

## 2022-12-07 MED ORDER — EPINEPHRINE 0.3 MG/0.3ML IJ SOAJ
0.3000 mg | Freq: Once | INTRAMUSCULAR | Status: AC
  Administered 2022-12-07: 0.3 mg via INTRAMUSCULAR
  Filled 2022-12-07: qty 0.3

## 2022-12-07 MED ORDER — PREDNISONE 50 MG PO TABS: ORAL_TABLET | ORAL | 0 refills | Status: DC

## 2022-12-07 MED ORDER — PREDNISONE 50 MG PO TABS
50.0000 mg | ORAL_TABLET | Freq: Every day | ORAL | 0 refills | Status: DC
  Filled 2022-12-07: qty 5, 5d supply, fill #0

## 2022-12-07 MED ORDER — DIPHENHYDRAMINE HCL 50 MG/ML IJ SOLN
25.0000 mg | Freq: Once | INTRAMUSCULAR | Status: AC
  Administered 2022-12-07: 25 mg via INTRAVENOUS
  Filled 2022-12-07: qty 1

## 2022-12-07 MED ORDER — FAMOTIDINE IN NACL 20-0.9 MG/50ML-% IV SOLN
20.0000 mg | Freq: Once | INTRAVENOUS | Status: AC
  Administered 2022-12-07: 20 mg via INTRAVENOUS
  Filled 2022-12-07: qty 50

## 2022-12-07 MED ORDER — METHYLPREDNISOLONE SODIUM SUCC 125 MG IJ SOLR
125.0000 mg | Freq: Once | INTRAMUSCULAR | Status: AC
  Administered 2022-12-07: 125 mg via INTRAVENOUS
  Filled 2022-12-07: qty 2

## 2022-12-07 NOTE — ED Notes (Signed)
Patient states is feeling some better, tongue seems less swollen. V/s stable

## 2022-12-07 NOTE — Discharge Instructions (Addendum)
Take the steroids as prescribed.  Use Benadryl as needed for itching.  Do not drive or operate heavy machinery when taking antihistamines as it may make you sleepy.  Follow-up with your primary physician as well as your allergist.  Return to the ED with difficulty breathing, tongue swelling, lip swelling, chest pain, shortness of breath or any other concerns.

## 2022-12-07 NOTE — ED Provider Notes (Signed)
Patient signed out to me at 07 100 by Dr. Wyvonnia Dusky pending reassessment.  In short this is a 72 year old female with a past medical history of prior angioedema presenting to the emergency department with left-sided tongue and throat swelling since she woke up early this morning.  Patient any other allergic symptoms.  She was given epinephrine, Benadryl, Pepcid and Solu-Medrol with some improvement.  She was signed out to me pending her total 4-hour observation for reassessment.  Upon my evaluation, the patient is awake and alert resting in bed comfortably in no acute distress.  The patient reports improvement of her swelling and has no significant obvious intraoral swelling on my exam.  Lungs are clear to auscultation bilaterally and she has no stridor.  Patient is stable for discharge home with outpatient primary care and allergy follow-up and was given strict return precautions.   Kemper Durie, DO 12/07/22 [phone]

## 2022-12-07 NOTE — ED Provider Notes (Signed)
[facility] Provider Note   CSN: [id number] Arrival date & time: 12/07/22  0359     History  Chief Complaint  Patient presents with   Oral Swelling    Katrina Evans is a 72 y.o. female.  Patient arrives with left-sided tongue swelling and throat swelling.  States she went to bed feeling well and woke up just prior to arrival with a swollen left tongue and difficulty swallowing.  No chest pain or shortness of breath.  No rash.  Has had swollen lips in the past several times but never had a swollen tongue previously.  Does not take any ACE inhibitors or ARB's. Presented here in July 2023 with left sided facial swelling and lip swelling.  She reports this is similar but involving her tongue.  States she has seen an allergist and not given a clear diagnosis.  She denies any new exposures to medications or foods.  The history is provided by the patient.       Home Medications Prior to Admission medications   Medication Sig Start Date End Date Taking? Authorizing Provider  acetaminophen (TYLENOL) 500 MG tablet Take 2 tablets (1,000 mg total) by mouth every 6 (six) hours. 03/08/22   [name], DO  atorvastatin (LIPITOR) 10 MG tablet Take 10 mg by mouth daily.    [provider]  ergocalciferol (VITAMIN D2) 1.25 MG (50000 UT) capsule Take 50,000 Units by mouth once a week.    [provider]  hydrochlorothiazide (HYDRODIURIL) 25 MG tablet TK 1 T PO QD IN THE MORNING FOR HIGH BP 06/24/19   [provider]  loratadine (CLARITIN) 10 MG tablet Take 10 mg by mouth daily as needed for allergies.    [provider]  oxyCODONE (OXY IR/ROXICODONE) 5 MG immediate release tablet Take 1-2 tablets (5-10 mg total) by mouth every 4 (four) hours as needed for moderate pain. 03/08/22   [name], DO  predniSONE (DELTASONE) 20 MG tablet Take 2 tablets (40 mg total) by mouth daily. 05/16/22   Horton, Barbette Hair, MD       Allergies    Diclofenac and Mobic [meloxicam]    Review of Systems   Review of Systems  Constitutional:  Negative for activity change, appetite change and fever.  HENT:  Positive for trouble swallowing. Negative for congestion.   Respiratory:  Negative for cough, chest tightness and shortness of breath.   Gastrointestinal:  Negative for abdominal pain, nausea and vomiting.  Genitourinary:  Negative for dysuria and hematuria.  Musculoskeletal:  Negative for arthralgias and myalgias.  Skin:  Negative for rash.  Neurological:  Negative for dizziness, weakness and headaches.   all other systems are negative except as noted in the HPI and PMH.    Physical Exam Updated Vital Signs BP (!) 196/81   Pulse 94   Resp 14   Ht 5' 2"$  (1.575 m)   Wt 87.1 kg   SpO2 99%   BMI 35.12 kg/m  Physical Exam Vitals and nursing note reviewed.  Constitutional:      General: She is not in acute distress.    Appearance: She is well-developed.  HENT:     Head: Normocephalic and atraumatic.     Mouth/Throat:     Pharynx: No oropharyngeal exudate.     Comments: Asymmetric swelling to left tongue.  No lip swelling.  Uvula is midline.  No appreciable posterior pharynx swelling.  Controlling secretions.  Floor of mouth is soft. Eyes:  Conjunctiva/sclera: Conjunctivae normal.     Pupils: Pupils are equal, round, and reactive to light.  Neck:     Comments: No meningismus. Cardiovascular:     Rate and Rhythm: Normal rate and regular rhythm.     Heart sounds: Normal heart sounds. No murmur heard. Pulmonary:     Effort: Pulmonary effort is normal. No respiratory distress.     Breath sounds: Normal breath sounds.  Abdominal:     Palpations: Abdomen is soft.     Tenderness: There is no abdominal tenderness. There is no guarding or rebound.  Musculoskeletal:        General: No tenderness. Normal range of motion.     Cervical back: Normal range of motion and neck supple.  Skin:    General: Skin is  warm.  Neurological:     Mental Status: She is alert and oriented to person, place, and time.     Cranial Nerves: No cranial nerve deficit.     Motor: No abnormal muscle tone.     Coordination: Coordination normal.     Comments:  5/5 strength throughout. CN 2-12 intact.Equal grip strength.   Psychiatric:        Behavior: Behavior normal.     ED Results / Procedures / Treatments   Labs (all labs ordered are listed, but only abnormal results are displayed) Labs Reviewed  BASIC METABOLIC PANEL - Abnormal; Notable for the following components:      Result Value   BUN 27 (*)    All other components within normal limits  CBC WITH DIFFERENTIAL/PLATELET    EKG None  Radiology No results found.  Procedures .Critical Care  Performed by: Ezequiel Essex, MD Authorized by: Ezequiel Essex, MD   Critical care provider statement:    Critical care time (minutes):  35   Critical care time was exclusive of:  Separately billable procedures and treating other patients   Critical care was necessary to treat or prevent imminent or life-threatening deterioration of the following conditions: angioedema.   Critical care was time spent personally by me on the following activities:  Development of treatment plan with patient or surrogate, discussions with consultants, evaluation of patient's response to treatment, examination of patient, ordering and review of laboratory studies, ordering and review of radiographic studies, ordering and performing treatments and interventions, pulse oximetry, re-evaluation of patient's condition, review of old charts, blood draw for specimens and obtaining history from patient or surrogate   I assumed direction of critical care for this patient from another provider in my specialty: no     Care discussed with: admitting provider       Medications Ordered in ED Medications  EPINEPHrine (EPI-PEN) injection 0.3 mg (has no administration in time range)   methylPREDNISolone sodium succinate (SOLU-MEDROL) 125 mg/2 mL injection 125 mg (has no administration in time range)  famotidine (PEPCID) IVPB 20 mg premix (has no administration in time range)  diphenhydrAMINE (BENADRYL) injection 25 mg (has no administration in time range)    ED Course/ Medical Decision Making/ A&P                             Medical Decision Making Amount and/or Complexity of Data Reviewed Labs: ordered. Decision-making details documented in ED Course. Radiology: ordered and independent interpretation performed. Decision-making details documented in ED Course. ECG/medicine tests: ordered and independent interpretation performed. Decision-making details documented in ED Course.  Risk Prescription drug management.   Angioedema of  uncertain etiology.  Has had several episodes in the past.  Does not take ACE inhibitor or ARB.  No hypoxia or increased work of breathing.  Controlling secretions.  Patient given epinephrine given sensation of throat tightness and tongue swelling.  Will also give steroids and antihistamines.  Question nonallergic angioedema versus potential hereditary angioedema. She was given Pepcid and Solu-Medrol.  Observed in the ED with improvement in her tongue swelling.  No chest pain or shortness of breath.  Able to swallow secretions.  6:30 AM, patient feels tongue is much improved.  Will continue to observe for a total of 3 hours after epinephrine shot.  She would likely be stable for outpatient follow-up with her PCP and allergist.  Will give course of steroids and antihistamines.  Has had angioedema several times in the past but never of tongue.  There is low suspicion for allergic component to angioedema at this time.  Tongue swelling continues to improve.  Patient tolerating p.o.  No chest pain or shortness of breath.  Anticipate discharge after period of observation after epinephrine injection.  This will be completed at 7:45 AM.  Care to  be transferred at shift change.        Final Clinical Impression(s) / ED Diagnoses Final diagnoses:  Angioedema, initial encounter    Rx / DC Orders ED Discharge Orders     None         Jaydin Jalomo, Annie Main, MD 12/07/22 [phone]

## 2022-12-07 NOTE — ED Triage Notes (Signed)
POV from home, sts that she woke up from sleep and noticed tongue was swollen. Left sided tongue swelling, sts her throat feels like it has a knot in it, sts difficulty swallowing. Controlling oral secretions at this time and denies difficulty breathing. This has happened q 2-4 weeks since last march. Amb to room, A&O x 4, GCS 15

## 2022-12-08 DIAGNOSIS — T783XXD Angioneurotic edema, subsequent encounter: Secondary | ICD-10-CM | POA: Diagnosis not present

## 2022-12-13 ENCOUNTER — Ambulatory Visit: Payer: Medicare HMO | Admitting: Internal Medicine

## 2022-12-13 ENCOUNTER — Encounter: Payer: Self-pay | Admitting: Internal Medicine

## 2022-12-13 VITALS — BP 140/69 | HR 85 | Ht 62.0 in | Wt 190.0 lb

## 2022-12-13 DIAGNOSIS — I10 Essential (primary) hypertension: Secondary | ICD-10-CM

## 2022-12-13 DIAGNOSIS — R0789 Other chest pain: Secondary | ICD-10-CM

## 2022-12-13 DIAGNOSIS — E782 Mixed hyperlipidemia: Secondary | ICD-10-CM

## 2022-12-13 DIAGNOSIS — R002 Palpitations: Secondary | ICD-10-CM

## 2022-12-13 MED ORDER — METOPROLOL SUCCINATE ER 25 MG PO TB24: 25.0000 mg | ORAL_TABLET | Freq: Every day | ORAL | 3 refills | Status: DC

## 2022-12-13 NOTE — Progress Notes (Signed)
Primary Physician/Referring:  Lennie Odor, PA  Patient ID: Katrina Evans, female    DOB: 1950-12-08, 72 y.o.   MRN: [id number]  Chief Complaint  Patient presents with  . Palpitations  . New Patient (Initial Visit)   HPI:    Katrina Evans  is a 72 y.o.   Past Medical History:  Diagnosis Date  . Cancer [facility])    history of melanoma, bottom or right foot, around 2020  . Edema of both lower extremities   . GERD (gastroesophageal reflux disease)   . High cholesterol   . Hypertension   . Joint pain   . Obesity   . Pre-diabetes   . SOB (shortness of breath)   . Vitamin D deficiency    Past Surgical History:  Procedure Laterality Date  . BREAST BIOPSY Right    No Scar, around 2011  . DILATATION & CURETTAGE/HYSTEROSCOPY WITH MYOSURE N/A 08/26/2021   Procedure: DILATATION & CURETTAGE/HYSTEROSCOPY WITH MYOSURE;  Surgeon: [name], DO;  Location: [place] [facility];  Service: Gynecology;  Laterality: N/A;  . LAPAROSCOPIC APPENDECTOMY N/A 08/17/2017   Procedure: APPENDECTOMY LAPAROSCOPIC;  Surgeon: Excell Seltzer, MD;  Location: [facility];  Service: General;  Laterality: N/A;  . laser vein surgery     many years ago per pt as of 03/02/22  . ROBOTIC ASSISTED LAPAROSCOPIC HYSTERECTOMY AND SALPINGECTOMY Bilateral 03/08/2022   Procedure: XI ROBOTIC ASSISTED LAPAROSCOPIC HYSTERECTOMY AND BILATERAL SALPINGO-OOPHORECTOMY.;  Surgeon: [name], DO;  Location: [place];  Service: Gynecology;  Laterality: Bilateral;  . TUBAL LIGATION  1983   Family History  Problem Relation Age of Onset  . Diabetes Mother   . High blood pressure Mother   . High Cholesterol Mother   . Stroke Mother   . Cancer Mother   . Depression Mother   . Schizophrenia Mother   . Obesity Mother   . High blood pressure Father   . High Cholesterol Father   . Heart disease Father   . Cancer Father   . Alcoholism Father   . Breast cancer Neg Hx     Social History    Tobacco Use  . Smoking status: Former    Packs/day: 1.00    Years: 35.00    Total pack years: 35.00    Types: Cigarettes    Quit date: 2005    Years since quitting: 19.1  . Smokeless tobacco: Never  Substance Use Topics  . Alcohol use: Yes    Comment: a drink twice a year   Marital Status: Divorced  ROS  Review of Systems  Cardiovascular:  Positive for chest pain, irregular heartbeat and palpitations.   Objective  Blood pressure (!) 140/69, pulse 85, height 5' 2"$  (1.575 m), weight 190 lb (86.2 kg), SpO2 97 %. Body mass index is 34.75 kg/m.     12/13/2022    3:08 PM 12/13/2022    2:57 PM 12/07/2022    7:15 AM  Vitals with BMI  Height  5' 2"$    Weight  190 lbs   BMI  [id number]   Systolic [id number]  Diastolic 69 67 66  Pulse 85 85 75     Physical Exam  Medications and allergies   Allergies  Allergen Reactions  . Diclofenac Nausea Only    Pt stated, "Upset my stomach." Patient states that she can take NSAIDS, just nothing too strong.  . Mobic [Meloxicam] Palpitations     Medication list after today's encounter   Current Outpatient  Medications:  .  acetaminophen (TYLENOL) 500 MG tablet, Take 2 tablets (1,000 mg total) by mouth every 6 (six) hours., Disp: 120 tablet, Rfl: 0 .  atorvastatin (LIPITOR) 10 MG tablet, Take 10 mg by mouth daily., Disp: , Rfl:  .  cetirizine (ZYRTEC ALLERGY) 10 MG tablet, Take 10 mg by mouth daily., Disp: , Rfl:  .  diphenhydrAMINE (BENADRYL) 25 MG tablet, Take 25 mg by mouth every 6 (six) hours as needed., Disp: , Rfl:  .  ergocalciferol (VITAMIN D2) 1.25 MG (50000 UT) capsule, Take 50,000 Units by mouth once a week., Disp: , Rfl:  .  famotidine (PEPCID) 40 MG tablet, Take 40 mg by mouth 2 (two) times daily., Disp: , Rfl:  .  fexofenadine (ALLEGRA) 180 MG tablet, Take 180 mg by mouth daily., Disp: , Rfl:  .  hydrochlorothiazide (HYDRODIURIL) 25 MG tablet, TK 1 T PO QD IN THE MORNING FOR HIGH BP, Disp: , Rfl:  .  metoprolol succinate (TOPROL  XL) 25 MG 24 hr tablet, Take 1 tablet (25 mg total) by mouth daily., Disp: 90 tablet, Rfl: 3 .  montelukast (SINGULAIR) 10 MG tablet, Take 10 mg by mouth daily., Disp: , Rfl:  .  Multiple Vitamins-Minerals (ALIVE HAIR, SKIN & NAILS) CHEW, Chew by mouth daily., Disp: , Rfl:   Laboratory examination:   Lab Results  Component Value Date   NA 141 12/07/2022   K 3.5 12/07/2022   CO2 28 12/07/2022   GLUCOSE 93 12/07/2022   BUN 27 (H) 12/07/2022   CREATININE 0.83 12/07/2022   CALCIUM 9.9 12/07/2022   GFRNONAA >60 12/07/2022       Latest Ref Rng & Units 12/07/2022    4:15 AM 03/04/2022    1:33 PM 08/23/2021    8:28 AM  CMP  Glucose 70 - 99 mg/dL 93  79  95   BUN 8 - 23 mg/dL 27  25  23   $ Creatinine 0.44 - 1.00 mg/dL 0.83  0.66  0.61   Sodium 135 - 145 mmol/L 141  138  138   Potassium 3.5 - 5.1 mmol/L 3.5  3.7  3.6   Chloride 98 - 111 mmol/L 104  101  104   CO2 22 - 32 mmol/L 28  26  28   $ Calcium 8.9 - 10.3 mg/dL 9.9  9.7  9.0       Latest Ref Rng & Units 12/07/2022    4:15 AM 03/04/2022    1:33 PM 08/23/2021    8:28 AM  CBC  WBC 4.0 - 10.5 K/uL 6.6  6.1  4.4   Hemoglobin 12.0 - 15.0 g/dL 14.7  14.2  14.0   Hematocrit 36.0 - 46.0 % 43.8  44.4  42.6   Platelets 150 - 400 K/uL 203  196  164     Lipid Panel No results for input(s): "CHOL", "TRIG", "LDLCALC", "VLDL", "HDL", "CHOLHDL", "LDLDIRECT" in the last 8760 hours.  HEMOGLOBIN A1C Lab Results  Component Value Date   HGBA1C 5.9 06/29/2021   TSH No results for input(s): "TSH" in the last 8760 hours.  External labs:   Labs 11/29/2022: TSH 1.99 HGB 13.9 HCT 42.2 PLT 182 LDL 114  Radiology:    Cardiac Studies:     EKG:   12/13/2022: Sinus Rhythm with iRBBB. Normal axis, no ischemia.  Assessment     ICD-10-CM   1. Palpitations  R00.2 EKG 12-Lead    PCV MYOCARDIAL PERFUSION WO LEXISCAN    PCV ECHOCARDIOGRAM COMPLETE  2. Chest pressure  R07.89 PCV MYOCARDIAL PERFUSION WO LEXISCAN    PCV ECHOCARDIOGRAM  COMPLETE    3. Essential hypertension  I10 PCV MYOCARDIAL PERFUSION WO LEXISCAN    PCV ECHOCARDIOGRAM COMPLETE    4. Mixed hyperlipidemia  E78.2 PCV MYOCARDIAL PERFUSION WO LEXISCAN    PCV ECHOCARDIOGRAM COMPLETE       Orders Placed This Encounter  Procedures  . PCV MYOCARDIAL PERFUSION WO LEXISCAN    Standing Status:   Future    Standing Expiration Date:   02/11/2023  . EKG 12-Lead  . PCV ECHOCARDIOGRAM COMPLETE    Standing Status:   Future    Standing Expiration Date:   12/14/2023    Meds ordered this encounter  Medications  . metoprolol succinate (TOPROL XL) 25 MG 24 hr tablet    Sig: Take 1 tablet (25 mg total) by mouth daily.    Dispense:  90 tablet    Refill:  3    Medications Discontinued During This Encounter  Medication Reason  . predniSONE (DELTASONE) 50 MG tablet Completed Course  . oxyCODONE (OXY IR/ROXICODONE) 5 MG immediate release tablet Completed Course  . loratadine (CLARITIN) 10 MG tablet Completed Course  . Ibuprofen (ADVIL) 200 MG CAPS Completed Course     Recommendations:   Katrina Evans is a 72 y.o.  female with chest pressure and palpitations  Palpitations Toprol XL 25 mg sent to pharmacy Can take twice daily if needed Palpitations do not occur frequently enough for event monitor - only once a month   Chest pressure Echocardiogram and stress test ordered Patient instructed not to do heavy lifting, heavy exertional activity, swimming until evaluation is complete.  Patient instructed to call if symptoms worse or to go to the ED for further evaluation.   Essential hypertension Continue current cardiac medications. Encourage low-sodium diet, less than 2000 mg daily. BP uncontrolled, will add Toprol Follow-up in 1-2 months or sooner if needed.   Mixed hyperlipidemia Continue statin PCP following lipids     Floydene Flock, DO, [facility]  12/13/2022, 3:13 PM Office: [phone] Pager: [phone]

## 2022-12-20 ENCOUNTER — Ambulatory Visit: Payer: Medicare HMO

## 2022-12-20 DIAGNOSIS — E782 Mixed hyperlipidemia: Secondary | ICD-10-CM | POA: Diagnosis not present

## 2022-12-20 DIAGNOSIS — I10 Essential (primary) hypertension: Secondary | ICD-10-CM

## 2022-12-20 DIAGNOSIS — R0789 Other chest pain: Secondary | ICD-10-CM | POA: Diagnosis not present

## 2022-12-20 DIAGNOSIS — R002 Palpitations: Secondary | ICD-10-CM

## 2022-12-22 NOTE — Progress Notes (Signed)
normal

## 2022-12-27 NOTE — Progress Notes (Signed)
Gave patient results, she acknowledged understanding and had no further questions.

## 2023-01-02 ENCOUNTER — Ambulatory Visit: Payer: Medicare HMO | Admitting: Physician Assistant

## 2023-01-03 ENCOUNTER — Encounter: Payer: Self-pay | Admitting: Physician Assistant

## 2023-01-03 ENCOUNTER — Ambulatory Visit: Payer: Medicare HMO | Admitting: Physician Assistant

## 2023-01-03 DIAGNOSIS — M25561 Pain in right knee: Secondary | ICD-10-CM

## 2023-01-03 DIAGNOSIS — M25562 Pain in left knee: Secondary | ICD-10-CM | POA: Diagnosis not present

## 2023-01-03 MED ORDER — BUPIVACAINE HCL 0.25 % IJ SOLN
2.0000 mL | INTRAMUSCULAR | Status: AC | PRN
  Administered 2023-01-03: 2 mL via INTRA_ARTICULAR

## 2023-01-03 MED ORDER — LIDOCAINE HCL 1 % IJ SOLN
2.0000 mL | INTRAMUSCULAR | Status: AC | PRN
  Administered 2023-01-03: 2 mL

## 2023-01-03 MED ORDER — METHYLPREDNISOLONE ACETATE 40 MG/ML IJ SUSP
80.0000 mg | INTRAMUSCULAR | Status: AC | PRN
  Administered 2023-01-03: 80 mg via INTRA_ARTICULAR

## 2023-01-03 NOTE — Progress Notes (Signed)
Office Visit Note   Patient: Katrina Evans           Date of Birth: 07/04/1951           MRN: [id number] Visit Date: 01/03/2023              Requested by: Lennie Odor, PA [address] [place],  [place] [address] PCP: Lennie Odor, [place]  Chief Complaint  Patient presents with  . Right Knee - Follow-up    Cortisone injection  . 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  She has gotten steroid injections with Dr. Sammuel Hines.  She is got good relief.  She comes in today requesting injections into her knees once again.  The right is worse than the left.  Assessment & Plan: Visit Diagnoses: Osteoarthritis bilateral knees  Plan: She is prediabetic and we had a discussion we will just go forward with the right knee today and she will come back late next week for the other injection into her left knee.  She is in agreement with this plan  Follow-Up Instructions: Return in about 2 weeks (around 01/17/2023).   Ortho Exam  Patient is alert, oriented, no adenopathy, well-dressed, normal affect, normal respiratory effort. Bilateral knees no effusion no redness compartments are soft and nontender she is neurovascularly intact.  Compartments are soft nontender  Imaging: No results found. No images are attached to the encounter.  Labs: Lab Results  Component Value Date   HGBA1C 5.9 06/29/2021   HGBA1C 6.0 (H) 04/29/2021     Lab Results  Component Value Date   ALBUMIN 3.9 06/29/2021    No results found for: "MG" Lab Results  Component Value Date   VD25OH 29 06/29/2021   VD25OH 50.8 04/29/2021    No results found for: "PREALBUMIN"    Latest Ref Rng & Units 12/07/2022    4:15 AM 03/04/2022    1:33 PM 08/23/2021    8:28 AM  CBC EXTENDED  WBC 4.0 - 10.5 K/uL 6.6  6.1  4.4   RBC 3.87 - 5.11 MIL/uL 4.62  4.59  4.37   Hemoglobin 12.0 - 15.0 g/dL 14.7  14.2   14.0   HCT 36.0 - 46.0 % 43.8  44.4  42.6   Platelets 150 - 400 K/uL 203  196  164   NEUT# 1.7 - 7.7 K/uL 2.9     Lymph# 0.7 - 4.0 K/uL 2.7        There is no height or weight on file to calculate BMI.  Orders:  No orders of the defined types were placed in this encounter.  No orders of the defined types were placed in this encounter.    Procedures: Large Joint Inj: R knee on 01/03/2023 9:16 AM Indications: pain and diagnostic evaluation Details: 25 G 1.5 in needle, anteromedial approach  Arthrogram: No  Medications: 80 mg methylPREDNISolone acetate 40 MG/ML; 2 mL lidocaine 1 %; 2 mL bupivacaine 0.25 % Outcome: tolerated well, no immediate complications Procedure, treatment alternatives, risks and benefits explained, specific risks discussed. Consent was given by the patient.    Clinical Data: No additional findings.  ROS:  All other systems negative, except as noted in the HPI. Review of Systems  Objective: Vital Signs: There were no vitals taken for this visit.  Specialty Comments:  No specialty comments available.  PMFS History: Patient Active Problem List   Diagnosis Date Noted  . Endometrial hyperplasia without atypia 03/08/2022  . Class 1 obesity due to excess calories with body mass index (BMI) of 34.0 to 34.9 in adult 04/29/2021  . Prediabetes 04/29/2021  . Essential hypertension 04/29/2021  . Hyperlipidemia 04/29/2021   Past Medical History:  Diagnosis Date  . Cancer [facility])    history of melanoma, bottom or right foot, around 2020  . Edema of both lower extremities   . GERD (gastroesophageal reflux disease)   . High cholesterol   . Hypertension   . Joint pain   . Obesity   . Pre-diabetes   . SOB (shortness of breath)   . Vitamin D deficiency     Family History  Problem Relation Age of Onset  . Diabetes Mother   . High blood pressure Mother   . High Cholesterol Mother   . Stroke Mother   . Cancer Mother   . Depression Mother   .  Schizophrenia Mother   . Obesity Mother   . High blood pressure Father   . High Cholesterol Father   . Heart disease Father   . Cancer Father   . Alcoholism Father   . Breast cancer Neg Hx     Past Surgical History:  Procedure Laterality Date  . BREAST BIOPSY Right    No Scar, around 2011  . DILATATION & CURETTAGE/HYSTEROSCOPY WITH MYOSURE N/A 08/26/2021   Procedure: DILATATION & CURETTAGE/HYSTEROSCOPY WITH MYOSURE;  Surgeon: [name], DO;  Location: [place];  Service: Gynecology;  Laterality: N/A;  . LAPAROSCOPIC APPENDECTOMY N/A 08/17/2017   Procedure: APPENDECTOMY LAPAROSCOPIC;  Surgeon: Excell Seltzer, MD;  Location: [facility];  Service: General;  Laterality: N/A;  . laser vein surgery     many years ago per pt as of 03/02/22  . ROBOTIC ASSISTED LAPAROSCOPIC HYSTERECTOMY AND SALPINGECTOMY Bilateral 03/08/2022   Procedure: XI ROBOTIC ASSISTED LAPAROSCOPIC HYSTERECTOMY AND BILATERAL SALPINGO-OOPHORECTOMY.;  Surgeon: [name], DO;  Location: [place];  Service: Gynecology;  Laterality: Bilateral;  . TUBAL LIGATION  1983   Social History   Occupational History  . Occupation: Product manager  Tobacco Use  . Smoking status: Former    Packs/day: 1.00    Years: 35.00    Total pack years: 35.00    Types: Cigarettes    Quit date: 2005    Years since quitting: 19.2  . Smokeless tobacco: Never  Vaping Use  . Vaping Use: Never used  Substance and Sexual Activity  . Alcohol use: Yes    Comment: a drink twice a year  . Drug use: No  . Sexual activity: Not Currently    Birth control/protection: Surgical

## 2023-01-05 ENCOUNTER — Other Ambulatory Visit: Payer: Medicare HMO

## 2023-01-06 ENCOUNTER — Ambulatory Visit: Payer: Medicare HMO | Admitting: Physician Assistant

## 2023-01-06 ENCOUNTER — Encounter: Payer: Self-pay | Admitting: Physician Assistant

## 2023-01-06 DIAGNOSIS — M25562 Pain in left knee: Secondary | ICD-10-CM | POA: Diagnosis not present

## 2023-01-06 DIAGNOSIS — M25561 Pain in right knee: Secondary | ICD-10-CM | POA: Diagnosis not present

## 2023-01-06 MED ORDER — BUPIVACAINE HCL 0.25 % IJ SOLN
2.0000 mL | INTRAMUSCULAR | Status: AC | PRN
  Administered 2023-01-06: 2 mL via INTRA_ARTICULAR

## 2023-01-06 MED ORDER — LIDOCAINE HCL 1 % IJ SOLN
2.0000 mL | INTRAMUSCULAR | Status: AC | PRN
  Administered 2023-01-06: 2 mL

## 2023-01-06 MED ORDER — METHYLPREDNISOLONE ACETATE 40 MG/ML IJ SUSP
80.0000 mg | INTRAMUSCULAR | Status: AC | PRN
  Administered 2023-01-06: 80 mg via INTRA_ARTICULAR

## 2023-01-06 NOTE — Progress Notes (Signed)
Office Visit Note   Patient: Katrina Evans           Date of Birth: 05-03-1951           MRN: [id number] Visit Date: 01/06/2023              Requested by: Lennie Odor, [place] [employer] [place],  [place] [address] PCP: Lennie Odor, [place]  Chief Complaint  Patient presents with  . Left Knee - Pain      HPI: [name] is a pleasant 72 year old woman with a history of right knee and left knee arthritis.  She has had steroid injections and gel injections.  Steroid injections seem to be more helpful to her.  She is prediabetic so we injected 1 knee earlier this week.  She did not have any problems and is wondering if she can get her left knee injected today as she is planning a trip to [place]: Visit Diagnoses: Osteoarthritis bilateral knees  Plan: Will go forward with left knee injection may follow-up as needed  Follow-Up Instructions: No follow-ups on file.   Ortho Exam  Patient is alert, oriented, no adenopathy, well-dressed, normal affect, normal respiratory effort. Left knee no effusion no redness she has some grinding with range of motion tenderness to deep palpation.  She neurovascular intact  Imaging: No results found. No images are attached to the encounter.  Labs: Lab Results  Component Value Date   HGBA1C 5.9 06/29/2021   HGBA1C 6.0 (H) 04/29/2021     Lab Results  Component Value Date   ALBUMIN 3.9 06/29/2021    No results found for: "MG" Lab Results  Component Value Date   VD25OH 29 06/29/2021   VD25OH 50.8 04/29/2021    No results found for: "PREALBUMIN"    Latest Ref Rng & Units 12/07/2022    4:15 AM 03/04/2022    1:33 PM 08/23/2021    8:28 AM  CBC EXTENDED  WBC 4.0 - 10.5 K/uL 6.6  6.1  4.4   RBC 3.87 - 5.11 MIL/uL 4.62  4.59  4.37   Hemoglobin 12.0 - 15.0 g/dL 14.7  14.2  14.0   HCT 36.0 - 46.0 % 43.8  44.4  42.6   Platelets 150 - 400 K/uL 203  196  164   NEUT# 1.7 - 7.7 K/uL 2.9     Lymph# 0.7 - 4.0  K/uL 2.7        There is no height or weight on file to calculate BMI.  Orders:  No orders of the defined types were placed in this encounter.  No orders of the defined types were placed in this encounter.    Procedures: Large Joint Inj: L knee on 01/06/2023 3:31 PM Indications: pain and diagnostic evaluation Details: 25 G 1.5 in needle, anteromedial approach  Arthrogram: No  Medications: 80 mg methylPREDNISolone acetate 40 MG/ML; 2 mL lidocaine 1 %; 2 mL bupivacaine 0.25 % Outcome: tolerated well, no immediate complications Procedure, treatment alternatives, risks and benefits explained, specific risks discussed. Consent was given by the patient. Immediately prior to procedure a time out was called to verify the correct patient, procedure, equipment, support staff and site/side marked as required. Patient was prepped and draped in the usual sterile fashion.    Clinical Data: No additional findings.  ROS:  All other systems negative, except as noted in the HPI. Review of Systems  Objective: Vital Signs: There were no vitals taken for this visit.  Specialty Comments:  No specialty comments available.  PMFS History: Patient Active Problem List   Diagnosis Date Noted  . Endometrial hyperplasia without atypia 03/08/2022  . Class 1 obesity due to excess calories with body mass index (BMI) of 34.0 to 34.9 in adult 04/29/2021  . Prediabetes 04/29/2021  . Essential hypertension 04/29/2021  . Hyperlipidemia 04/29/2021   Past Medical History:  Diagnosis Date  . Cancer [facility])    history of melanoma, bottom or right foot, around 2020  . Edema of both lower extremities   . GERD (gastroesophageal reflux disease)   . High cholesterol   . Hypertension   . Joint pain   . Obesity   . Pre-diabetes   . SOB (shortness of breath)   . Vitamin D deficiency     Family History  Problem Relation Age of Onset  . Diabetes Mother   . High blood pressure Mother   . High Cholesterol  Mother   . Stroke Mother   . Cancer Mother   . Depression Mother   . Schizophrenia Mother   . Obesity Mother   . High blood pressure Father   . High Cholesterol Father   . Heart disease Father   . Cancer Father   . Alcoholism Father   . Breast cancer Neg Hx     Past Surgical History:  Procedure Laterality Date  . BREAST BIOPSY Right    No Scar, around 2011  . DILATATION & CURETTAGE/HYSTEROSCOPY WITH MYOSURE N/A 08/26/2021   Procedure: DILATATION & CURETTAGE/HYSTEROSCOPY WITH MYOSURE;  Surgeon: [name], DO;  Location: [facility];  Service: Gynecology;  Laterality: N/A;  . LAPAROSCOPIC APPENDECTOMY N/A 08/17/2017   Procedure: APPENDECTOMY LAPAROSCOPIC;  Surgeon: Excell Seltzer, MD;  Location: [facility];  Service: General;  Laterality: N/A;  . laser vein surgery     many years ago per pt as of 03/02/22  . ROBOTIC ASSISTED LAPAROSCOPIC HYSTERECTOMY AND SALPINGECTOMY Bilateral 03/08/2022   Procedure: XI ROBOTIC ASSISTED LAPAROSCOPIC HYSTERECTOMY AND BILATERAL SALPINGO-OOPHORECTOMY.;  Surgeon: [name], DO;  Location: [facility];  Service: Gynecology;  Laterality: Bilateral;  . TUBAL LIGATION  1983   Social History   Occupational History  . Occupation: Product manager  Tobacco Use  . Smoking status: Former    Packs/day: 1.00    Years: 35.00    Additional pack years: 0.00    Total pack years: 35.00    Types: Cigarettes    Quit date: 2005    Years since quitting: 19.2  . Smokeless tobacco: Never  Vaping Use  . Vaping Use: Never used  Substance and Sexual Activity  . Alcohol use: Yes    Comment: a drink twice a year  . Drug use: No  . Sexual activity: Not Currently    Birth control/protection: Surgical

## 2023-01-11 ENCOUNTER — Ambulatory Visit: Payer: Medicare HMO | Admitting: Physician Assistant

## 2023-01-24 ENCOUNTER — Ambulatory Visit: Payer: Medicare HMO | Admitting: Internal Medicine

## 2023-01-25 DIAGNOSIS — L812 Freckles: Secondary | ICD-10-CM | POA: Diagnosis not present

## 2023-01-25 DIAGNOSIS — D225 Melanocytic nevi of trunk: Secondary | ICD-10-CM | POA: Diagnosis not present

## 2023-01-25 DIAGNOSIS — Z85820 Personal history of malignant melanoma of skin: Secondary | ICD-10-CM | POA: Diagnosis not present

## 2023-01-25 DIAGNOSIS — D224 Melanocytic nevi of scalp and neck: Secondary | ICD-10-CM | POA: Diagnosis not present

## 2023-01-25 DIAGNOSIS — L821 Other seborrheic keratosis: Secondary | ICD-10-CM | POA: Diagnosis not present

## 2023-01-25 DIAGNOSIS — L218 Other seborrheic dermatitis: Secondary | ICD-10-CM | POA: Diagnosis not present

## 2023-02-02 ENCOUNTER — Ambulatory Visit: Payer: Medicare HMO

## 2023-02-02 DIAGNOSIS — R002 Palpitations: Secondary | ICD-10-CM | POA: Diagnosis not present

## 2023-02-02 DIAGNOSIS — I10 Essential (primary) hypertension: Secondary | ICD-10-CM

## 2023-02-02 DIAGNOSIS — R0789 Other chest pain: Secondary | ICD-10-CM | POA: Diagnosis not present

## 2023-02-02 DIAGNOSIS — E782 Mixed hyperlipidemia: Secondary | ICD-10-CM

## 2023-02-03 NOTE — Progress Notes (Signed)
normal

## 2023-02-03 NOTE — Progress Notes (Signed)
Called patient to inform her about her stress test. Patient understood.

## 2023-02-07 ENCOUNTER — Encounter: Payer: Self-pay | Admitting: Physician Assistant

## 2023-02-07 ENCOUNTER — Ambulatory Visit: Payer: Medicare HMO | Admitting: Physician Assistant

## 2023-02-07 ENCOUNTER — Other Ambulatory Visit: Payer: Self-pay

## 2023-02-07 DIAGNOSIS — M170 Bilateral primary osteoarthritis of knee: Secondary | ICD-10-CM

## 2023-02-07 DIAGNOSIS — M79644 Pain in right finger(s): Secondary | ICD-10-CM | POA: Diagnosis not present

## 2023-02-07 NOTE — Progress Notes (Addendum)
Office Visit Note   Patient: Katrina Evans           Date of Birth: 02/16/1951           MRN: [id number] Visit Date: 02/07/2023              Requested by: Katrina Height, PA [address] [place],  [place] [address] PCP: Katrina Height, PA   Assessment & Plan: Visit Diagnoses:  1. Finger pain, right   2. Primary osteoarthritis of both knees     Plan: Katrina Evans is a pleasant 72 year old woman who returns today for 2 reasons.  First she is here to follow-up on her bilateral knee pain.  She has x-rays consistent with moderate to severe degenerative changes in her knees.  Right is more painful than left.  She has gotten steroid injections in the past with Dr. Steward Evans and she did good good relief.  In the last month I repeated injections and she really did not get much relief at all.  She is also had gel injections and did not get good relief.  She has tried physical therapy.  She has not tried topical Voltaren gel.  She cannot take oral anti-inflammatories because they upset her stomach.  This is beginning to be a significant problem for her especially going up and down stairs and she still is working Clinical cytogeneticist buildings.  She also came in today for swelling in her right index finger.  She had no injury no signs of infection and her x-rays did not show any fracture.  She had good flexion and extension actively and good capillary refill.  She had no discoloration at all.  I would like for her to follow-up in 2 weeks for her index finger and in the meantime could use topical Voltaren gel on this.  She also can try topical Voltaren gel on her knees.  She is interested in discussing surgical options mainly a knee replacement though she is not quite sure when she wants to do this.  Because of these reasons I did review her case with Dr. Ophelia Evans today and she would like to discuss this with him.  He could also recheck on her finger.  If she has any increasing symptoms in the finger in the  meantime like redness swelling fever chills she will contact me.  Her current BMI is 34, and she is not a diabetic.  Her x-rays are 13 months old so it may be worth repeating new x-rays of her knees  Follow-Up Instructions: No follow-ups on file.   Orders:  Orders Placed This Encounter  Procedures   XR Finger Index Right   No orders of the defined types were placed in this encounter.     Procedures: No procedures performed   Clinical Data: No additional findings.   Subjective: Chief Complaint  Patient presents with   Right Index Finger - Pain   Right Knee - Pain, Follow-up   Left Knee - Pain, Follow-up    HPI pleasant 72 year old woman here to follow-up on her bilateral knee pain.  Also comes in today with swelling in her right index finger associated with pain.  No noted injury  Review of Systems  All other systems reviewed and are negative.    Objective: Vital Signs: There were no vitals taken for this visit.  Physical Exam Constitutional:      Appearance: Normal appearance.  Pulmonary:     Effort: Pulmonary effort is normal.  Skin:  General: Skin is warm and dry.  Neurological:     General: No focal deficit present.     Mental Status: She is alert.     Ortho Exam Right index finger: She has a strong radial pulse in her hand is warm and she has less than 2 seconds capillary refill in all of her fingers.  The index finger is swollen however there is no associated erythema discoloration or temperature changes she is able to actively flex and extend her finger limited just slightly by swelling. Bilateral knees she has no effusion no erythema she has tenderness globally over the knee with limited patellar mobility compartments are soft and nontender she is neurovascular intact Specialty Comments:  No specialty comments available.  Imaging: No results found.   PMFS History: Patient Active Problem List   Diagnosis Date Noted   Osteoarthritis of knees,  bilateral 02/07/2023   Endometrial hyperplasia without atypia 03/08/2022   Class 1 obesity due to excess calories with body mass index (BMI) of 34.0 to 34.9 in adult 04/29/2021   Prediabetes 04/29/2021   Essential hypertension 04/29/2021   Hyperlipidemia 04/29/2021   Past Medical History:  Diagnosis Date   Cancer [facility])    history of melanoma, bottom or right foot, around 2020   Edema of both lower extremities    GERD (gastroesophageal reflux disease)    High cholesterol    Hypertension    Joint pain    Obesity    Pre-diabetes    SOB (shortness of breath)    Vitamin D deficiency     Family History  Problem Relation Age of Onset   Diabetes Mother    High blood pressure Mother    High Cholesterol Mother    Stroke Mother    Cancer Mother    Depression Mother    Schizophrenia Mother    Obesity Mother    High blood pressure Father    High Cholesterol Father    Heart disease Father    Cancer Father    Alcoholism Father    Breast cancer Neg Hx     Past Surgical History:  Procedure Laterality Date   BREAST BIOPSY Right    No Scar, around 2011   DILATATION & CURETTAGE/HYSTEROSCOPY WITH MYOSURE N/A 08/26/2021   Procedure: DILATATION & CURETTAGE/HYSTEROSCOPY WITH MYOSURE;  Surgeon: Katrina Ready, DO;  Location: [facility];  Service: Gynecology;  Laterality: N/A;   LAPAROSCOPIC APPENDECTOMY N/A 08/17/2017   Procedure: APPENDECTOMY LAPAROSCOPIC;  Surgeon: Katrina Fellows, MD;  [facility];  Service: General;  Laterality: N/A;   laser vein surgery     many years ago per pt as of 03/02/22   ROBOTIC ASSISTED LAPAROSCOPIC HYSTERECTOMY AND SALPINGECTOMY Bilateral 03/08/2022   Procedure: XI ROBOTIC ASSISTED LAPAROSCOPIC HYSTERECTOMY AND BILATERAL SALPINGO-OOPHORECTOMY.;  Surgeon: Katrina Ready, DO;  [facility];  Service: Gynecology;  Laterality: Bilateral;   TUBAL LIGATION  1983   Social History   Occupational History    Occupation: Scientist, research (medical)  Tobacco Use   Smoking status: Former    Packs/day: 1.00    Years: 35.00    Additional pack years: 0.00    Total pack years: 35.00    Types: Cigarettes    Quit date: 2005    Years since quitting: 19.3   Smokeless tobacco: Never  Vaping Use   Vaping Use: Never used  Substance and Sexual Activity   Alcohol use: Yes    Comment: a drink twice a year  Drug use: No   Sexual activity: Not Currently    Birth control/protection: Surgical

## 2023-02-07 NOTE — Progress Notes (Deleted)
Office Visit Note   Patient: Katrina Evans           Date of Birth: 05-Jan-1951           MRN: [id number] Visit Date: 02/07/2023              Requested by: Milus Height, PA [address] [place],  [place] [address] PCP: Milus Height, PA      HPI: ***  Assessment & Plan: Visit Diagnoses: No diagnosis found.  Plan: ***  Follow-Up Instructions: No follow-ups on file.   Ortho Exam  Patient is alert, oriented, no adenopathy, well-dressed, normal affect, normal respiratory effort. ***  Imaging: No results found. No images are attached to the encounter.  Labs: Lab Results  Component Value Date   HGBA1C 5.9 06/29/2021   HGBA1C 6.0 (H) 04/29/2021     Lab Results  Component Value Date   ALBUMIN 3.9 06/29/2021    No results found for: "MG" Lab Results  Component Value Date   VD25OH 29 06/29/2021   VD25OH 50.8 04/29/2021    No results found for: "PREALBUMIN"    Latest Ref Rng & Units 12/07/2022    4:15 AM 03/04/2022    1:33 PM 08/23/2021    8:28 AM  CBC EXTENDED  WBC 4.0 - 10.5 K/uL 6.6  6.1  4.4   RBC 3.87 - 5.11 MIL/uL 4.62  4.59  4.37   Hemoglobin 12.0 - 15.0 g/dL 19.1  47.8  29.5   HCT 36.0 - 46.0 % 43.8  44.4  42.6   Platelets 150 - 400 K/uL 203  196  164   NEUT# 1.7 - 7.7 K/uL 2.9     Lymph# 0.7 - 4.0 K/uL 2.7        There is no height or weight on file to calculate BMI.  Orders:  No orders of the defined types were placed in this encounter.  No orders of the defined types were placed in this encounter.    Procedures: No procedures performed  Clinical Data: No additional findings.  ROS:  All other systems negative, except as noted in the HPI. Review of Systems  Objective: Vital Signs: There were no vitals taken for this visit.  Specialty Comments:  No specialty comments available.  PMFS History: Patient Active Problem List   Diagnosis Date Noted   Endometrial hyperplasia without atypia 03/08/2022   Class 1  obesity due to excess calories with body mass index (BMI) of 34.0 to 34.9 in adult 04/29/2021   Prediabetes 04/29/2021   Essential hypertension 04/29/2021   Hyperlipidemia 04/29/2021   Past Medical History:  Diagnosis Date   Cancer [facility])    history of melanoma, bottom or right foot, around 2020   Edema of both lower extremities    GERD (gastroesophageal reflux disease)    High cholesterol    Hypertension    Joint pain    Obesity    Pre-diabetes    SOB (shortness of breath)    Vitamin D deficiency     Family History  Problem Relation Age of Onset   Diabetes Mother    High blood pressure Mother    High Cholesterol Mother    Stroke Mother    Cancer Mother    Depression Mother    Schizophrenia Mother    Obesity Mother    High blood pressure Father    High Cholesterol Father    Heart disease Father    Cancer Father  Alcoholism Father    Breast cancer Neg Hx     Past Surgical History:  Procedure Laterality Date   BREAST BIOPSY Right    No Scar, around 2011   DILATATION & CURETTAGE/HYSTEROSCOPY WITH MYOSURE N/A 08/26/2021   Procedure: DILATATION & CURETTAGE/HYSTEROSCOPY WITH MYOSURE;  Surgeon: Steva Ready, DO;  Location: [facility] [place];  Service: Gynecology;  Laterality: N/A;   LAPAROSCOPIC APPENDECTOMY N/A 08/17/2017   Procedure: APPENDECTOMY LAPAROSCOPIC;  Surgeon: Glenna Fellows, MD;  Location[facility];  Service: General;  Laterality: N/A;   laser vein surgery     many years ago per pt as of 03/02/22   ROBOTIC ASSISTED LAPAROSCOPIC HYSTERECTOMY AND SALPINGECTOMY Bilateral 03/08/2022   Procedure: XI ROBOTIC ASSISTED LAPAROSCOPIC HYSTERECTOMY AND BILATERAL SALPINGO-OOPHORECTOMY.;  Surgeon: Steva Ready, DO;  Location: [facility];  Service: Gynecology;  Laterality: Bilateral;   TUBAL LIGATION  1983   Social History   Occupational History   Occupation: Scientist, research (medical)  Tobacco Use   Smoking status: Former    Packs/day:  1.00    Years: 35.00    Additional pack years: 0.00    Total pack years: 35.00    Types: Cigarettes    Quit date: 2005    Years since quitting: 19.3   Smokeless tobacco: Never  Vaping Use   Vaping Use: Never used  Substance and Sexual Activity   Alcohol use: Yes    Comment: a drink twice a year   Drug use: No   Sexual activity: Not Currently    Birth control/protection: Surgical

## 2023-02-08 ENCOUNTER — Ambulatory Visit: Payer: Medicare HMO | Admitting: Internal Medicine

## 2023-02-13 DIAGNOSIS — L821 Other seborrheic keratosis: Secondary | ICD-10-CM | POA: Diagnosis not present

## 2023-02-16 ENCOUNTER — Encounter: Payer: Self-pay | Admitting: Internal Medicine

## 2023-02-16 ENCOUNTER — Ambulatory Visit: Payer: Medicare HMO | Admitting: Internal Medicine

## 2023-02-16 VITALS — BP 148/59 | HR 72 | Ht 62.0 in | Wt 195.0 lb

## 2023-02-16 DIAGNOSIS — I10 Essential (primary) hypertension: Secondary | ICD-10-CM | POA: Diagnosis not present

## 2023-02-16 DIAGNOSIS — E782 Mixed hyperlipidemia: Secondary | ICD-10-CM | POA: Diagnosis not present

## 2023-02-16 DIAGNOSIS — R002 Palpitations: Secondary | ICD-10-CM

## 2023-02-16 NOTE — Progress Notes (Signed)
Primary Physician/Referring:  Milus Height, PA  Patient ID: Katrina Evans, female    DOB: 01-Feb-1951, 72 y.o.   MRN: [id number]  Chief Complaint  Patient presents with   Palpitations   Follow-up   Results   HPI:    Katrina Evans  is a 72 y.o. female with past medical history significant for hypertension, hyperlipidemia, and angioedema who is here for a follow-up visit. She has been doing well since the last time she was here. No concerns or complaints today. Patient has not been having any palpitations or irregular heart rates. Denies chest pain, shortness of breath, palpitations, diaphoresis, syncope, edema, PND, orthopnea.   Past Medical History:  Diagnosis Date   Cancer    history of melanoma, bottom or right foot, around 2020   Edema of both lower extremities    GERD (gastroesophageal reflux disease)    High cholesterol    Hypertension    Joint pain    Obesity    Pre-diabetes    SOB (shortness of breath)    Vitamin D deficiency    Past Surgical History:  Procedure Laterality Date   BREAST BIOPSY Right    No Scar, around 2011   DILATATION & CURETTAGE/HYSTEROSCOPY WITH MYOSURE N/A 08/26/2021   Procedure: DILATATION & CURETTAGE/HYSTEROSCOPY WITH MYOSURE;  Surgeon: Steva Ready, DO;  Location: [facility];  Service: Gynecology;  Laterality: N/A;   LAPAROSCOPIC APPENDECTOMY N/A 08/17/2017   Procedure: APPENDECTOMY LAPAROSCOPIC;  Surgeon: Glenna Fellows, MD;  Location: [facility];  Service: General;  Laterality: N/A;   laser vein surgery     many years ago per pt as of 03/02/22   ROBOTIC ASSISTED LAPAROSCOPIC HYSTERECTOMY AND SALPINGECTOMY Bilateral 03/08/2022   Procedure: XI ROBOTIC ASSISTED LAPAROSCOPIC HYSTERECTOMY AND BILATERAL SALPINGO-OOPHORECTOMY.;  Surgeon: Steva Ready, DO;  Location: [facility];  Service: Gynecology;  Laterality: Bilateral;   TUBAL LIGATION  1983   Family History  Problem Relation Age of Onset    Diabetes Mother    High blood pressure Mother    High Cholesterol Mother    Stroke Mother    Cancer Mother    Depression Mother    Schizophrenia Mother    Obesity Mother    High blood pressure Father    High Cholesterol Father    Heart disease Father    Cancer Father    Alcoholism Father    Breast cancer Neg Hx     Social History   Tobacco Use   Smoking status: Former    Packs/day: 1.00    Years: 35.00    Additional pack years: 0.00    Total pack years: 35.00    Types: Cigarettes    Quit date: 2005    Years since quitting: 19.3   Smokeless tobacco: Never  Substance Use Topics   Alcohol use: Yes    Comment: a drink twice a year   Marital Status: Divorced  ROS  Review of Systems  Cardiovascular:  Negative for chest pain, irregular heartbeat and palpitations.   Objective  Blood pressure (!) 148/59, pulse 72, height 5\' 2"  (1.575 m), weight 195 lb (88.5 kg), SpO2 97 %. Body mass index is 35.67 kg/m.     02/16/2023    9:36 AM 12/13/2022    3:08 PM 12/13/2022    2:57 PM  Vitals with BMI  Height 5\' 2"   5\' 2"   Weight 195 lbs  190 lbs  BMI 35.66  34.74  Systolic 148 140 657  Diastolic  59 69 67  Pulse 72 85 85     Physical Exam Vitals reviewed.  HENT:     Head: Normocephalic and atraumatic.  Cardiovascular:     Rate and Rhythm: Normal rate and regular rhythm.     Pulses: Normal pulses.     Heart sounds: Normal heart sounds. No murmur heard. Pulmonary:     Effort: Pulmonary effort is normal.     Breath sounds: Normal breath sounds.  Abdominal:     General: Bowel sounds are normal.  Musculoskeletal:     Right lower leg: No edema.     Left lower leg: No edema.  Skin:    General: Skin is warm and dry.  Neurological:     Mental Status: She is alert.     Medications and allergies   Allergies  Allergen Reactions   Diclofenac Nausea Only    Pt stated, "Upset my stomach." Patient states that she can take NSAIDS, just nothing too strong.   Mobic [Meloxicam]  Palpitations     Medication list after today's encounter   Current Outpatient Medications:    acetaminophen (TYLENOL) 500 MG tablet, Take 2 tablets (1,000 mg total) by mouth every 6 (six) hours., Disp: 120 tablet, Rfl: 0   atorvastatin (LIPITOR) 10 MG tablet, Take 10 mg by mouth daily., Disp: , Rfl:    cetirizine (ZYRTEC ALLERGY) 10 MG tablet, Take 10 mg by mouth daily., Disp: , Rfl:    diphenhydrAMINE (BENADRYL) 25 MG tablet, Take 25 mg by mouth every 6 (six) hours as needed., Disp: , Rfl:    ergocalciferol (VITAMIN D2) 1.25 MG (50000 UT) capsule, Take 50,000 Units by mouth once a week., Disp: , Rfl:    famotidine (PEPCID) 40 MG tablet, Take 40 mg by mouth 2 (two) times daily., Disp: , Rfl:    fexofenadine (ALLEGRA) 180 MG tablet, Take 180 mg by mouth daily., Disp: , Rfl:    hydrochlorothiazide (HYDRODIURIL) 25 MG tablet, TK 1 T PO QD IN THE MORNING FOR HIGH BP, Disp: , Rfl:    montelukast (SINGULAIR) 10 MG tablet, Take 10 mg by mouth daily., Disp: , Rfl:    metoprolol succinate (TOPROL XL) 25 MG 24 hr tablet, Take 1 tablet (25 mg total) by mouth daily. (Patient not taking: Reported on 02/16/2023), Disp: 90 tablet, Rfl: 3   Multiple Vitamins-Minerals (ALIVE HAIR, SKIN & NAILS) CHEW, Chew by mouth daily., Disp: , Rfl:   Laboratory examination:   Lab Results  Component Value Date   NA 141 12/07/2022   K 3.5 12/07/2022   CO2 28 12/07/2022   GLUCOSE 93 12/07/2022   BUN 27 (H) 12/07/2022   CREATININE 0.83 12/07/2022   CALCIUM 9.9 12/07/2022   GFRNONAA >60 12/07/2022       Latest Ref Rng & Units 12/07/2022    4:15 AM 03/04/2022    1:33 PM 08/23/2021    8:28 AM  CMP  Glucose 70 - 99 mg/dL 93  79  95   BUN 8 - 23 mg/dL Creatinine 0.44 - 1.00 mg/dL 9.60  4.54  0.98   Sodium 135 - 145 mmol/L 141  138  138   Potassium 3.5 - 5.1 mmol/L 3.5  3.7  3.6   Chloride 98 - 111 mmol/L 104  101  104   CO2 22 - 32 mmol/L Calcium 8.9 - 10.3 mg/dL 9.9  9.7  9.0  Latest Ref Rng & Units 12/07/2022    4:15 AM 03/04/2022    1:33 PM 08/23/2021    8:28 AM  CBC  WBC 4.0 - 10.5 K/uL 6.6  6.1  4.4   Hemoglobin 12.0 - 15.0 g/dL 16.1  09.6  04.5   Hematocrit 36.0 - 46.0 % 43.8  44.4  42.6   Platelets 150 - 400 K/uL 203  196  164     Lipid Panel No results for input(s): "CHOL", "TRIG", "LDLCALC", "VLDL", "HDL", "CHOLHDL", "LDLDIRECT" in the last 8760 hours.  HEMOGLOBIN A1C Lab Results  Component Value Date   HGBA1C 5.9 06/29/2021   TSH No results for input(s): "TSH" in the last 8760 hours.  External labs:   Labs 11/29/2022: TSH 1.99 HGB 13.9 HCT 42.2 PLT 182 LDL 114  Radiology:    Cardiac Studies:   Exercise nuclear stress test 02/02/2023 Myocardial perfusion is normal. Overall LV systolic function is normal without regional wall motion abnormalities. Stress LV EF: 61%. Low risk study. Normal ECG stress. The patient exercised for 5 minutes and 0 seconds of a Bruce protocol, achieving approximately 7.05 METs and 90% MPHR. Peak EKG/ECG demonstrated sinus tachycardia. The heart rate response was normal. The blood pressure response was normal. No previous exam available for comparison.  Echocardiogram 12/20/2022: Normal LV systolic function with visual EF 55-60%. Left ventricle cavity is normal in size. Mild concentric hypertrophy of the left ventricle. Normal global wall motion. Normal diastolic filling pattern. Calculated EF 65%. Structurally normal trileaflet aortic valve.  Mild to moderate aortic regurgitation. Structurally normal tricuspid valve with no regurgitation. No evidence of pulmonary hypertension. Pericardium is normal. Trace pericardial effusion. There is no hemodynamic significance. The aortic root is mildly dilated.    EKG:   12/13/2022: Sinus Rhythm with iRBBB. Normal axis, no ischemia.  Assessment     ICD-10-CM   1. Palpitations  R00.2     2. Mixed hyperlipidemia  E78.2     3. Essential hypertension  I10          No orders of the defined types were placed in this encounter.   No orders of the defined types were placed in this encounter.   There are no discontinued medications.    Recommendations:   Katrina Evans is a 72 y.o.  female with chest pressure and palpitations  Palpitations Toprol XL 25 mg prescribed at our last visit Can take twice daily if needed Palpitations do not occur frequently enough for event monitor - only once a month Echo and stress test within normal limits, discussed with patient   Essential hypertension Continue current cardiac medications. Encourage low-sodium diet, less than 2000 mg daily. BP uncontrolled here but says it is better at home Follow-up in 6-12 months or sooner if needed.   Mixed hyperlipidemia Continue statin PCP following lipids     Clotilde Dieter, DO, [facility]  02/16/2023, 10:19 AM Office: [phone] Pager: [phone]

## 2023-02-21 ENCOUNTER — Ambulatory Visit: Payer: Medicare HMO | Admitting: Orthopaedic Surgery

## 2023-03-08 DIAGNOSIS — T783XXD Angioneurotic edema, subsequent encounter: Secondary | ICD-10-CM | POA: Diagnosis not present

## 2023-03-08 DIAGNOSIS — J300 Vasomotor rhinitis: Secondary | ICD-10-CM | POA: Diagnosis not present

## 2023-03-09 DIAGNOSIS — M1711 Unilateral primary osteoarthritis, right knee: Secondary | ICD-10-CM | POA: Diagnosis not present

## 2023-03-27 ENCOUNTER — Other Ambulatory Visit: Payer: Self-pay | Admitting: Physician Assistant

## 2023-03-27 DIAGNOSIS — Z0000 Encounter for general adult medical examination without abnormal findings: Secondary | ICD-10-CM

## 2023-04-10 ENCOUNTER — Other Ambulatory Visit: Payer: Self-pay

## 2023-04-10 ENCOUNTER — Emergency Department (HOSPITAL_BASED_OUTPATIENT_CLINIC_OR_DEPARTMENT_OTHER)
Admission: EM | Admit: 2023-04-10 | Discharge: 2023-04-11 | Disposition: A | Discharge status: Home / Self Care | Payer: Medicare HMO | Attending: Emergency Medicine | Admitting: Emergency Medicine

## 2023-04-10 ENCOUNTER — Encounter (HOSPITAL_BASED_OUTPATIENT_CLINIC_OR_DEPARTMENT_OTHER): Payer: Self-pay

## 2023-04-10 DIAGNOSIS — T783XXA Angioneurotic edema, initial encounter: Secondary | ICD-10-CM

## 2023-04-10 MED ORDER — DIPHENHYDRAMINE HCL 50 MG/ML IJ SOLN
25.0000 mg | Freq: Once | INTRAMUSCULAR | Status: AC
  Administered 2023-04-10: 25 mg via INTRAVENOUS
  Filled 2023-04-10: qty 1

## 2023-04-10 MED ORDER — METHYLPREDNISOLONE SODIUM SUCC 125 MG IJ SOLR
125.0000 mg | Freq: Once | INTRAMUSCULAR | Status: AC
  Administered 2023-04-10: 125 mg via INTRAVENOUS
  Filled 2023-04-10: qty 2

## 2023-04-10 NOTE — ED Provider Notes (Incomplete)
[place] EMERGENCY DEPARTMENT AT [facility] Provider Note   CSN: [id number] Arrival date & time: 04/10/23  2202     History {Add pertinent medical, surgical, social history, OB history to HPI:1} Chief Complaint  Patient presents with  . Oral Swelling    Katrina Evans is a 72 y.o. female.  Patient presents to the emergency department for evaluation of tongue swelling.  She has been seen in the emergency department for tongue/lip swelling last July and in February of this year.  Patient does not take lisinopril and does not know what causes these symptoms.  She was concerned tonight that her throat may be closing.  No associated lightheadedness or syncope.  No vomiting or diarrhea.  No skin rashes.  She took Careers adviser and Pepcid prior to arrival.  No EpiPen given.       Home Medications Prior to Admission medications   Medication Sig Start Date End Date Taking? Authorizing Provider  acetaminophen (TYLENOL) 500 MG tablet Take 2 tablets (1,000 mg total) by mouth every 6 (six) hours. 03/08/22   Steva Ready, DO  atorvastatin (LIPITOR) 10 MG tablet Take 10 mg by mouth daily.    [provider]  cetirizine (ZYRTEC ALLERGY) 10 MG tablet Take 10 mg by mouth daily.    [provider]  diphenhydrAMINE (BENADRYL) 25 MG tablet Take 25 mg by mouth every 6 (six) hours as needed. 07/30/22   [provider]  ergocalciferol (VITAMIN D2) 1.25 MG (50000 UT) capsule Take 50,000 Units by mouth once a week.    [provider]  famotidine (PEPCID) 40 MG tablet Take 40 mg by mouth 2 (two) times daily. 12/07/22   [provider]  fexofenadine (ALLEGRA) 180 MG tablet Take 180 mg by mouth daily.    [provider]  hydrochlorothiazide (HYDRODIURIL) 25 MG tablet TK 1 T PO QD IN THE MORNING FOR HIGH BP 06/24/19   [provider]  metoprolol succinate (TOPROL XL) 25 MG 24 hr tablet Take 1 tablet (25 mg total) by mouth daily. Patient not  taking: Reported on 02/16/2023 12/13/22   Custovic, Rozell Searing, DO  montelukast (SINGULAIR) 10 MG tablet Take 10 mg by mouth daily. 12/07/22   [provider]  Multiple Vitamins-Minerals (ALIVE HAIR, SKIN & NAILS) CHEW Chew by mouth daily.    [provider]      Allergies    Diclofenac and Mobic [meloxicam]    Review of Systems   Review of Systems  Physical Exam Updated Vital Signs BP (!) 121/96   Pulse 78   Temp 98 F (36.7 C)   Resp 18   Ht 5\' 2"  (1.575 m)   Wt 86.2 kg   SpO2 98%   BMI 34.75 kg/m  Physical Exam Vitals and nursing note reviewed.  Constitutional:      Appearance: She is well-developed.  HENT:     Head: Normocephalic and atraumatic.     Right Ear: External ear normal.     Left Ear: External ear normal.     Mouth/Throat:     Mouth: Mucous membranes are moist.     Comments: Mild edema of the left anterior tongue.  Posterior oropharynx appears clear.  No voice changes. Eyes:     Conjunctiva/sclera: Conjunctivae normal.  Cardiovascular:     Rate and Rhythm: Normal rate.  Pulmonary:     Effort: No respiratory distress.     Breath sounds: No stridor. No wheezing.     Comments: Lungs are clear  bilaterally. Musculoskeletal:     Cervical back: Normal range of motion and neck supple.  Skin:    General: Skin is warm and dry.  Neurological:     Mental Status: She is alert.  Psychiatric:        Mood and Affect: Mood is anxious.     ED Results / Procedures / Treatments   Labs (all labs ordered are listed, but only abnormal results are displayed) Labs Reviewed - No data to display  EKG None  Radiology No results found.  Procedures Procedures  {Document cardiac monitor, telemetry assessment procedure when appropriate:1}  Medications Ordered in ED Medications  methylPREDNISolone sodium succinate (SOLU-MEDROL) 125 mg/2 mL injection 125 mg (125 mg Intravenous Given 04/10/23 2252)  diphenhydrAMINE (BENADRYL) injection 25 mg (25 mg  Intravenous Given 04/10/23 2252)    ED Course/ Medical Decision Making/ A&P    Patient seen and examined. History obtained directly from patient.  I reviewed previous ED visits for similar symptoms.  Labs/EKG: None ordered  Imaging: None ordered  Medications/Fluids: Ordered: IV Solu-Medrol, IV Benadryl.   Most recent vital signs reviewed and are as follows: BP (!) 121/96   Pulse 78   Temp 98 F (36.7 C)   Resp 18   Ht 5\' 2"  (1.575 m)   Wt 86.2 kg   SpO2 98%   BMI 34.75 kg/m   Initial impression: Angioedema, recurrent, unclear etiology.    {   Click here for ABCD2, HEART and other calculatorsREFRESH Note before signing :1}                          Medical Decision Making Risk Prescription drug management.   ***  {Document critical care time when appropriate:1} {Document review of labs and clinical decision tools ie heart score, Chads2Vasc2 etc:1}  {Document your independent review of radiology images, and any outside records:1} {Document your discussion with family members, caretakers, and with consultants:1} {Document social determinants of health affecting pt's care:1} {Document your decision making why or why not admission, treatments were needed:1} Final Clinical Impression(s) / ED Diagnoses Final diagnoses:  None    Rx / DC Orders ED Discharge Orders     None

## 2023-04-10 NOTE — ED Triage Notes (Signed)
POV from home, A&O x 4, GCS 15, amb to triage.   Tongue swelling began at 2100, facial swelling left side, happened before, has epi pen but did not use. Able to speak but slightly impaired, sts minimal difficulty swallowing but controlling oral secretions.  Took allegra and pepsid 2145

## 2023-04-10 NOTE — ED Provider Notes (Signed)
[place] EMERGENCY DEPARTMENT AT [facility] Provider Note   CSN: [id number] Arrival date & time: 04/10/23  2202     History  Chief Complaint  Patient presents with   Oral Swelling    Katrina Evans is a 72 y.o. female.  Patient presents to the emergency department for evaluation of tongue swelling.  She has been seen in the emergency department for tongue/lip swelling last July and in February of this year.  Patient does not take lisinopril and does not know what causes these symptoms.  She was concerned tonight that her throat may be closing.  No associated lightheadedness or syncope.  No vomiting or diarrhea.  No skin rashes.  She took Careers adviser and Pepcid prior to arrival.  No EpiPen given.       Home Medications Prior to Admission medications   Medication Sig Start Date End Date Taking? Authorizing Provider  acetaminophen (TYLENOL) 500 MG tablet Take 2 tablets (1,000 mg total) by mouth every 6 (six) hours. 03/08/22   Steva Ready, DO  atorvastatin (LIPITOR) 10 MG tablet Take 10 mg by mouth daily.    [provider]  cetirizine (ZYRTEC ALLERGY) 10 MG tablet Take 10 mg by mouth daily.    [provider]  diphenhydrAMINE (BENADRYL) 25 MG tablet Take 25 mg by mouth every 6 (six) hours as needed. 07/30/22   [provider]  ergocalciferol (VITAMIN D2) 1.25 MG (50000 UT) capsule Take 50,000 Units by mouth once a week.    [provider]  famotidine (PEPCID) 40 MG tablet Take 40 mg by mouth 2 (two) times daily. 12/07/22   [provider]  fexofenadine (ALLEGRA) 180 MG tablet Take 180 mg by mouth daily.    [provider]  hydrochlorothiazide (HYDRODIURIL) 25 MG tablet TK 1 T PO QD IN THE MORNING FOR HIGH BP 06/24/19   [provider]  metoprolol succinate (TOPROL XL) 25 MG 24 hr tablet Take 1 tablet (25 mg total) by mouth daily. Patient not taking: Reported on 02/16/2023 12/13/22   Custovic, Rozell Searing, DO   montelukast (SINGULAIR) 10 MG tablet Take 10 mg by mouth daily. 12/07/22   [provider]  Multiple Vitamins-Minerals (ALIVE HAIR, SKIN & NAILS) CHEW Chew by mouth daily.    [provider]      Allergies    Diclofenac and Mobic [meloxicam]    Review of Systems   Review of Systems  Physical Exam Updated Vital Signs BP (!) 121/96   Pulse 78   Temp 98 F (36.7 C)   Resp 18   Ht 5\' 2"  (1.575 m)   Wt 86.2 kg   SpO2 98%   BMI 34.75 kg/m  Physical Exam Vitals and nursing note reviewed.  Constitutional:      Appearance: She is well-developed.  HENT:     Head: Normocephalic and atraumatic.     Right Ear: External ear normal.     Left Ear: External ear normal.     Mouth/Throat:     Mouth: Mucous membranes are moist.     Comments: Mild edema of the left anterior tongue.  Posterior oropharynx appears clear.  No voice changes. Eyes:     Conjunctiva/sclera: Conjunctivae normal.  Cardiovascular:     Rate and Rhythm: Normal rate.  Pulmonary:     Effort: No respiratory distress.     Breath sounds: No stridor. No wheezing.     Comments: Lungs are clear bilaterally. Musculoskeletal:     Cervical back: Normal  range of motion and neck supple.  Skin:    General: Skin is warm and dry.  Neurological:     Mental Status: She is alert.  Psychiatric:        Mood and Affect: Mood is anxious.     ED Results / Procedures / Treatments   Labs (all labs ordered are listed, but only abnormal results are displayed) Labs Reviewed - No data to display  EKG None  Radiology No results found.  Procedures Procedures    Medications Ordered in ED Medications  methylPREDNISolone sodium succinate (SOLU-MEDROL) 125 mg/2 mL injection 125 mg (125 mg Intravenous Given 04/10/23 2252)  diphenhydrAMINE (BENADRYL) injection 25 mg (25 mg Intravenous Given 04/10/23 2252)    ED Course/ Medical Decision Making/ A&P    Patient seen and examined. History obtained directly from  patient.  I reviewed previous ED visits for similar symptoms.  Labs/EKG: None ordered  Imaging: None ordered  Medications/Fluids: Ordered: IV Solu-Medrol, IV Benadryl.   Most recent vital signs reviewed and are as follows: BP (!) 121/96   Pulse 78   Temp 98 F (36.7 C)   Resp 18   Ht 5\' 2"  (1.575 m)   Wt 86.2 kg   SpO2 98%   BMI 34.75 kg/m   Initial impression: Angioedema, recurrent, unclear etiology.  12:07 AM Reassessment performed. Patient appears stable.  She states that she feels slightly improved.  On exam, still with some left-sided tongue swelling.  Reviewed pertinent lab work and imaging with patient at bedside. Questions answered.   Most current vital signs reviewed and are as follows: BP (!) 121/96   Pulse 78   Temp 98 F (36.7 C)   Resp 18   Ht 5\' 2"  (1.575 m)   Wt 86.2 kg   SpO2 98%   BMI 34.75 kg/m   Plan: Continue monitoring, recheck around 2 AM.  If stable, consider discharge to home with steroid burst and continued antihistamine blockade.  Encouraged follow-up with her allergist.  Signout to Dr. Judd Lien at shift change.                             Medical Decision Making Risk Prescription drug management.   Patient with isolated angioedema, tongue swelling tonight, history of same.  Stable so far during ED evaluation.  No other symptoms to suggest anaphylaxis.  No signs of impending airway compromise.        Final Clinical Impression(s) / ED Diagnoses Final diagnoses:  Angioedema, initial encounter    Rx / DC Orders ED Discharge Orders     None         Renne Crigler, PA-C 04/11/23 0008    Geoffery Lyons, MD 04/11/23 [phone]

## 2023-04-11 MED ORDER — DIPHENHYDRAMINE HCL 25 MG PO TABS: 25.0000 mg | ORAL_TABLET | Freq: Four times a day (QID) | ORAL | 0 refills | Status: DC

## 2023-04-11 MED ORDER — PREDNISONE 20 MG PO TABS: 40.0000 mg | ORAL_TABLET | Freq: Every day | ORAL | 0 refills | Status: DC

## 2023-04-11 NOTE — Discharge Instructions (Signed)
Begin taking prednisone and Benadryl as prescribed.  Follow-up with primary doctor if not improving in the next few days, and return to the ER if symptoms significantly worsen or change.

## 2023-04-11 NOTE — ED Notes (Signed)
RN reviewed discharge instructions with pt. Pt verbalized understanding and had no further questions. VSS upon discharge.  

## 2023-04-11 NOTE — ED Notes (Signed)
Tongue swelling significantly decreased, no new SOB, rash, or additional swelling reported

## 2023-04-13 DIAGNOSIS — T7840XA Allergy, unspecified, initial encounter: Secondary | ICD-10-CM | POA: Diagnosis not present

## 2023-04-13 DIAGNOSIS — I10 Essential (primary) hypertension: Secondary | ICD-10-CM | POA: Diagnosis not present

## 2023-04-18 ENCOUNTER — Ambulatory Visit
Admission: RE | Admit: 2023-04-18 | Discharge: 2023-04-18 | Disposition: A | Discharge status: Home / Self Care | Payer: Medicare HMO | Source: Ambulatory Visit | Attending: Physician Assistant | Admitting: Physician Assistant

## 2023-04-18 DIAGNOSIS — Z1231 Encounter for screening mammogram for malignant neoplasm of breast: Secondary | ICD-10-CM | POA: Diagnosis not present

## 2023-04-18 DIAGNOSIS — Z0000 Encounter for general adult medical examination without abnormal findings: Secondary | ICD-10-CM

## 2023-04-24 DIAGNOSIS — R221 Localized swelling, mass and lump, neck: Secondary | ICD-10-CM | POA: Diagnosis not present

## 2023-04-24 DIAGNOSIS — K047 Periapical abscess without sinus: Secondary | ICD-10-CM | POA: Diagnosis not present

## 2023-04-28 DIAGNOSIS — R221 Localized swelling, mass and lump, neck: Secondary | ICD-10-CM | POA: Diagnosis not present

## 2023-05-25 DIAGNOSIS — M1711 Unilateral primary osteoarthritis, right knee: Secondary | ICD-10-CM | POA: Diagnosis not present

## 2023-05-26 DIAGNOSIS — M25562 Pain in left knee: Secondary | ICD-10-CM | POA: Diagnosis not present

## 2023-05-30 DIAGNOSIS — Z961 Presence of intraocular lens: Secondary | ICD-10-CM | POA: Diagnosis not present

## 2023-05-30 DIAGNOSIS — H43811 Vitreous degeneration, right eye: Secondary | ICD-10-CM | POA: Diagnosis not present

## 2023-06-21 DIAGNOSIS — J069 Acute upper respiratory infection, unspecified: Secondary | ICD-10-CM | POA: Diagnosis not present

## 2023-06-21 DIAGNOSIS — I10 Essential (primary) hypertension: Secondary | ICD-10-CM | POA: Diagnosis not present

## 2023-06-21 DIAGNOSIS — R7303 Prediabetes: Secondary | ICD-10-CM | POA: Diagnosis not present

## 2023-06-21 DIAGNOSIS — H6692 Otitis media, unspecified, left ear: Secondary | ICD-10-CM | POA: Diagnosis not present

## 2023-06-21 DIAGNOSIS — I700 Atherosclerosis of aorta: Secondary | ICD-10-CM | POA: Diagnosis not present

## 2023-06-21 DIAGNOSIS — D696 Thrombocytopenia, unspecified: Secondary | ICD-10-CM | POA: Diagnosis not present

## 2023-06-28 DIAGNOSIS — W57XXXA Bitten or stung by nonvenomous insect and other nonvenomous arthropods, initial encounter: Secondary | ICD-10-CM | POA: Diagnosis not present

## 2023-06-28 DIAGNOSIS — S80861A Insect bite (nonvenomous), right lower leg, initial encounter: Secondary | ICD-10-CM | POA: Diagnosis not present

## 2023-06-28 DIAGNOSIS — H6692 Otitis media, unspecified, left ear: Secondary | ICD-10-CM | POA: Diagnosis not present

## 2023-06-28 DIAGNOSIS — S80862A Insect bite (nonvenomous), left lower leg, initial encounter: Secondary | ICD-10-CM | POA: Diagnosis not present

## 2023-06-28 DIAGNOSIS — R7303 Prediabetes: Secondary | ICD-10-CM | POA: Diagnosis not present

## 2023-06-28 DIAGNOSIS — R059 Cough, unspecified: Secondary | ICD-10-CM | POA: Diagnosis not present

## 2023-07-12 DIAGNOSIS — K219 Gastro-esophageal reflux disease without esophagitis: Secondary | ICD-10-CM | POA: Diagnosis not present

## 2023-07-12 DIAGNOSIS — I10 Essential (primary) hypertension: Secondary | ICD-10-CM | POA: Diagnosis not present

## 2023-07-12 DIAGNOSIS — Z85820 Personal history of malignant melanoma of skin: Secondary | ICD-10-CM | POA: Diagnosis not present

## 2023-07-12 DIAGNOSIS — Z0000 Encounter for general adult medical examination without abnormal findings: Secondary | ICD-10-CM | POA: Diagnosis not present

## 2023-07-12 DIAGNOSIS — M8580 Other specified disorders of bone density and structure, unspecified site: Secondary | ICD-10-CM | POA: Diagnosis not present

## 2023-07-12 DIAGNOSIS — R7303 Prediabetes: Secondary | ICD-10-CM | POA: Diagnosis not present

## 2023-07-12 DIAGNOSIS — I872 Venous insufficiency (chronic) (peripheral): Secondary | ICD-10-CM | POA: Diagnosis not present

## 2023-07-12 DIAGNOSIS — E7800 Pure hypercholesterolemia, unspecified: Secondary | ICD-10-CM | POA: Diagnosis not present

## 2023-07-12 DIAGNOSIS — M1990 Unspecified osteoarthritis, unspecified site: Secondary | ICD-10-CM | POA: Diagnosis not present

## 2023-07-26 DIAGNOSIS — R635 Abnormal weight gain: Secondary | ICD-10-CM | POA: Diagnosis not present

## 2023-07-26 DIAGNOSIS — N3941 Urge incontinence: Secondary | ICD-10-CM | POA: Diagnosis not present

## 2023-07-26 DIAGNOSIS — Z01419 Encounter for gynecological examination (general) (routine) without abnormal findings: Secondary | ICD-10-CM | POA: Diagnosis not present

## 2023-08-07 DIAGNOSIS — T7840XA Allergy, unspecified, initial encounter: Secondary | ICD-10-CM | POA: Diagnosis not present

## 2023-08-23 ENCOUNTER — Ambulatory Visit (HOSPITAL_COMMUNITY)
Admission: RE | Admit: 2023-08-23 | Discharge: 2023-08-23 | Disposition: A | Discharge status: Home / Self Care | Payer: Medicare HMO | Source: Ambulatory Visit | Attending: Vascular Surgery | Admitting: Vascular Surgery

## 2023-08-23 ENCOUNTER — Other Ambulatory Visit (HOSPITAL_COMMUNITY): Payer: Self-pay | Admitting: Physician Assistant

## 2023-08-23 DIAGNOSIS — R52 Pain, unspecified: Secondary | ICD-10-CM | POA: Diagnosis not present

## 2023-08-23 DIAGNOSIS — M1711 Unilateral primary osteoarthritis, right knee: Secondary | ICD-10-CM | POA: Diagnosis not present

## 2023-08-23 DIAGNOSIS — M25562 Pain in left knee: Secondary | ICD-10-CM | POA: Diagnosis not present

## 2023-09-06 ENCOUNTER — Emergency Department (HOSPITAL_BASED_OUTPATIENT_CLINIC_OR_DEPARTMENT_OTHER)
Admission: EM | Admit: 2023-09-06 | Discharge: 2023-09-06 | Disposition: A | Discharge status: Home / Self Care | Payer: Medicare HMO | Attending: Emergency Medicine | Admitting: Emergency Medicine

## 2023-09-06 ENCOUNTER — Encounter (HOSPITAL_BASED_OUTPATIENT_CLINIC_OR_DEPARTMENT_OTHER): Payer: Self-pay

## 2023-09-06 DIAGNOSIS — T7840XA Allergy, unspecified, initial encounter: Secondary | ICD-10-CM | POA: Insufficient documentation

## 2023-09-06 DIAGNOSIS — I10 Essential (primary) hypertension: Secondary | ICD-10-CM | POA: Diagnosis not present

## 2023-09-06 DIAGNOSIS — R600 Localized edema: Secondary | ICD-10-CM | POA: Diagnosis not present

## 2023-09-06 DIAGNOSIS — Z79899 Other long term (current) drug therapy: Secondary | ICD-10-CM | POA: Insufficient documentation

## 2023-09-06 DIAGNOSIS — R220 Localized swelling, mass and lump, head: Secondary | ICD-10-CM | POA: Diagnosis not present

## 2023-09-06 DIAGNOSIS — K130 Diseases of lips: Secondary | ICD-10-CM | POA: Diagnosis not present

## 2023-09-06 LAB — CBC WITH DIFFERENTIAL/PLATELET
Abs Immature Granulocytes: 0.01 10*3/uL (ref 0.00–0.07)
Basophils Absolute: 0.1 10*3/uL (ref 0.0–0.1)
Basophils Relative: 1 %
Eosinophils Absolute: 0.2 10*3/uL (ref 0.0–0.5)
Eosinophils Relative: 3 %
HCT: 42.3 % (ref 36.0–46.0)
Hemoglobin: 14.4 g/dL (ref 12.0–15.0)
Immature Granulocytes: 0 %
Lymphocytes Relative: 36 %
Lymphs Abs: 2.4 10*3/uL (ref 0.7–4.0)
MCH: 31.9 pg (ref 26.0–34.0)
MCHC: 34 g/dL (ref 30.0–36.0)
MCV: 93.6 fL (ref 80.0–100.0)
Monocytes Absolute: 0.6 10*3/uL (ref 0.1–1.0)
Monocytes Relative: 9 %
Neutro Abs: 3.5 10*3/uL (ref 1.7–7.7)
Neutrophils Relative %: 51 %
Platelets: 151 10*3/uL (ref 150–400)
RBC: 4.52 MIL/uL (ref 3.87–5.11)
RDW: 12.9 % (ref 11.5–15.5)
WBC: 6.8 10*3/uL (ref 4.0–10.5)
nRBC: 0 % (ref 0.0–0.2)

## 2023-09-06 LAB — BASIC METABOLIC PANEL
Anion gap: 9 (ref 5–15)
BUN: 28 mg/dL — ABNORMAL HIGH (ref 8–23)
CO2: 28 mmol/L (ref 22–32)
Calcium: 9.9 mg/dL (ref 8.9–10.3)
Chloride: 102 mmol/L (ref 98–111)
Creatinine, Ser: 0.87 mg/dL (ref 0.44–1.00)
GFR, Estimated: >60 mL/min (ref >60)
Glucose, Bld: 97 mg/dL (ref 70–99)
Potassium: 3.8 mmol/L (ref 3.5–5.1)
Sodium: 139 mmol/L (ref 135–145)

## 2023-09-06 MED ORDER — PREDNISONE 10 MG PO TABS: 20.0000 mg | ORAL_TABLET | Freq: Two times a day (BID) | ORAL | 0 refills | Status: DC

## 2023-09-06 MED ORDER — FAMOTIDINE IN NACL 20-0.9 MG/50ML-% IV SOLN
20.0000 mg | Freq: Once | INTRAVENOUS | Status: AC
  Administered 2023-09-06: 20 mg via INTRAVENOUS
  Filled 2023-09-06: qty 50

## 2023-09-06 MED ORDER — DIPHENHYDRAMINE HCL 50 MG/ML IJ SOLN
25.0000 mg | Freq: Once | INTRAMUSCULAR | Status: AC
  Administered 2023-09-06: 25 mg via INTRAVENOUS
  Filled 2023-09-06: qty 1

## 2023-09-06 MED ORDER — METHYLPREDNISOLONE SODIUM SUCC 125 MG IJ SOLR
125.0000 mg | Freq: Once | INTRAMUSCULAR | Status: AC
  Administered 2023-09-06: 125 mg via INTRAVENOUS
  Filled 2023-09-06: qty 2

## 2023-09-06 NOTE — ED Triage Notes (Signed)
Pt states she ate "the works pizza and chocolate covered peanuts" states she felt a blister on her lip and noticeda small amt of swelling to her lower lip.  She took 25mg  benadryl at bedtime and woke up with her lower face and bottom lip extremely swollen.  Tongue normal in size and upper lip is normal.  NAD, denies any itching or difficulty breathing.

## 2023-09-06 NOTE — ED Provider Notes (Signed)
[place] EMERGENCY DEPARTMENT AT [facility] Provider Note   CSN: [id number] Arrival date & time: 09/06/23  4098     History  Chief Complaint  Patient presents with   Allergic Reaction    Katrina Evans is a 72 y.o. female.  Patient is a 72 year old female with history of hypertension, hyperlipidemia.  Patient presenting today for evaluation of lip swelling.  She reports eating chocolate covered peanuts and a leftover slice of pizza this evening, then shortly after started with swelling of her lower lip.  This has happened to her several times in the past and has had to visit the ER for the same.  She denies any intraoral involvement.  She denies any difficulty breathing or swallowing.  The history is provided by the patient.       Home Medications Prior to Admission medications   Medication Sig Start Date End Date Taking? Authorizing Provider  acetaminophen (TYLENOL) 500 MG tablet Take 2 tablets (1,000 mg total) by mouth every 6 (six) hours. 03/08/22   Steva Ready, DO  atorvastatin (LIPITOR) 10 MG tablet Take 10 mg by mouth daily.    [provider]  cetirizine (ZYRTEC ALLERGY) 10 MG tablet Take 10 mg by mouth daily.    [provider]  diphenhydrAMINE (BENADRYL) 25 MG tablet Take 1 tablet (25 mg total) by mouth every 6 (six) hours for 5 days. 04/11/23 04/16/23  Renne Crigler, PA-C  ergocalciferol (VITAMIN D2) 1.25 MG (50000 UT) capsule Take 50,000 Units by mouth once a week.    [provider]  famotidine (PEPCID) 40 MG tablet Take 40 mg by mouth 2 (two) times daily. 12/07/22   [provider]  fexofenadine (ALLEGRA) 180 MG tablet Take 180 mg by mouth daily.    [provider]  hydrochlorothiazide (HYDRODIURIL) 25 MG tablet TK 1 T PO QD IN THE MORNING FOR HIGH BP 06/24/19   [provider]  metoprolol succinate (TOPROL XL) 25 MG 24 hr tablet Take 1 tablet (25 mg total) by mouth daily. Patient not taking:  Reported on 02/16/2023 12/13/22   Custovic, Rozell Searing, DO  montelukast (SINGULAIR) 10 MG tablet Take 10 mg by mouth daily. 12/07/22   [provider]  Multiple Vitamins-Minerals (ALIVE HAIR, SKIN & NAILS) CHEW Chew by mouth daily.    [provider]  predniSONE (DELTASONE) 20 MG tablet Take 2 tablets (40 mg total) by mouth daily. 04/11/23   Renne Crigler, PA-C      Allergies    Diclofenac and Mobic [meloxicam]    Review of Systems   Review of Systems  All other systems reviewed and are negative.   Physical Exam Updated Vital Signs BP (!) 169/79   Pulse 88   Temp 97.6 F (36.4 C) (Oral)   Resp 18   Ht 5\' 2"  (1.575 m)   Wt 86.2 kg   SpO2 98%   BMI 34.75 kg/m  Physical Exam Vitals and nursing note reviewed.  Constitutional:      General: She is not in acute distress.    Appearance: She is well-developed. She is not diaphoretic.  HENT:     Head: Normocephalic and atraumatic.     Mouth/Throat:     Mouth: Mucous membranes are moist.     Pharynx: No oropharyngeal exudate or posterior oropharyngeal erythema.     Comments: There is marked swelling noted of the lower lip, but no intraoral involvement. Cardiovascular:     Rate and Rhythm: Normal rate and regular rhythm.  Heart sounds: No murmur heard.    No friction rub. No gallop.  Pulmonary:     Effort: Pulmonary effort is normal. No respiratory distress.     Breath sounds: Normal breath sounds. No stridor. No wheezing.  Abdominal:     General: Bowel sounds are normal. There is no distension.     Palpations: Abdomen is soft.     Tenderness: There is no abdominal tenderness.  Musculoskeletal:        General: Normal range of motion.     Cervical back: Normal range of motion and neck supple.  Skin:    General: Skin is warm and dry.  Neurological:     General: No focal deficit present.     Mental Status: She is alert and oriented to person, place, and time.     ED Results / Procedures / Treatments    Labs (all labs ordered are listed, but only abnormal results are displayed) Labs Reviewed  BASIC METABOLIC PANEL  CBC WITH DIFFERENTIAL/PLATELET    EKG None  Radiology No results found.  Procedures Procedures    Medications Ordered in ED Medications  methylPREDNISolone sodium succinate (SOLU-MEDROL) 125 mg/2 mL injection 125 mg (has no administration in time range)  diphenhydrAMINE (BENADRYL) injection 25 mg (has no administration in time range)  famotidine (PEPCID) IVPB 20 mg premix (has no administration in time range)    ED Course/ Medical Decision Making/ A&P  Patient is a 72 year old female presenting with lower lip swelling as described in the HPI.  She does have marked swelling of her lower lip upon presentation but physical examination is otherwise unremarkable.  There is no stridor or intraoral involvement.  Vitals are stable and patient is afebrile.  Laboratory studies included CBC and basic metabolic panel, both of which are unremarkable.  She has received Solu-Medrol along with Pepcid and Benadryl and symptoms seem to be improving somewhat.  Patient to be discharged with prednisone and Benadryl and is to follow-up as needed.  Final Clinical Impression(s) / ED Diagnoses Final diagnoses:  None    Rx / DC Orders ED Discharge Orders     None         Geoffery Lyons, MD 09/06/23 [phone]

## 2023-09-06 NOTE — Discharge Instructions (Signed)
Begin taking prednisone as prescribed.  Take Benadryl 25 mg every 8 hours for the next 3 days.  Return to the ER for worsening swelling, difficulty breathing or swallowing, or for other new and concerning symptoms.

## 2023-09-08 DIAGNOSIS — T783XXD Angioneurotic edema, subsequent encounter: Secondary | ICD-10-CM | POA: Diagnosis not present

## 2023-09-08 DIAGNOSIS — J300 Vasomotor rhinitis: Secondary | ICD-10-CM | POA: Diagnosis not present

## 2023-09-11 DIAGNOSIS — T783XXD Angioneurotic edema, subsequent encounter: Secondary | ICD-10-CM | POA: Diagnosis not present

## 2023-09-19 DIAGNOSIS — H10412 Chronic giant papillary conjunctivitis, left eye: Secondary | ICD-10-CM | POA: Diagnosis not present

## 2023-09-28 DIAGNOSIS — H10413 Chronic giant papillary conjunctivitis, bilateral: Secondary | ICD-10-CM | POA: Diagnosis not present

## 2023-10-11 DIAGNOSIS — R7989 Other specified abnormal findings of blood chemistry: Secondary | ICD-10-CM | POA: Diagnosis not present

## 2023-10-11 DIAGNOSIS — R778 Other specified abnormalities of plasma proteins: Secondary | ICD-10-CM | POA: Diagnosis not present

## 2023-10-11 DIAGNOSIS — R220 Localized swelling, mass and lump, head: Secondary | ICD-10-CM | POA: Diagnosis not present

## 2023-10-11 DIAGNOSIS — T783XXA Angioneurotic edema, initial encounter: Secondary | ICD-10-CM | POA: Diagnosis not present

## 2023-10-11 DIAGNOSIS — D8989 Other specified disorders involving the immune mechanism, not elsewhere classified: Secondary | ICD-10-CM | POA: Diagnosis not present

## 2023-10-11 DIAGNOSIS — R221 Localized swelling, mass and lump, neck: Secondary | ICD-10-CM | POA: Diagnosis not present

## 2023-10-11 DIAGNOSIS — E559 Vitamin D deficiency, unspecified: Secondary | ICD-10-CM | POA: Diagnosis not present

## 2023-10-11 DIAGNOSIS — R5383 Other fatigue: Secondary | ICD-10-CM | POA: Diagnosis not present

## 2023-10-13 DIAGNOSIS — T783XXA Angioneurotic edema, initial encounter: Secondary | ICD-10-CM | POA: Diagnosis not present

## 2023-10-13 DIAGNOSIS — E7800 Pure hypercholesterolemia, unspecified: Secondary | ICD-10-CM | POA: Diagnosis not present

## 2023-10-13 DIAGNOSIS — I10 Essential (primary) hypertension: Secondary | ICD-10-CM | POA: Diagnosis not present

## 2023-10-20 DIAGNOSIS — M170 Bilateral primary osteoarthritis of knee: Secondary | ICD-10-CM | POA: Diagnosis not present

## 2023-12-11 ENCOUNTER — Ambulatory Visit (HOSPITAL_BASED_OUTPATIENT_CLINIC_OR_DEPARTMENT_OTHER): Payer: Medicare HMO | Admitting: Family Medicine

## 2023-12-11 ENCOUNTER — Encounter (HOSPITAL_BASED_OUTPATIENT_CLINIC_OR_DEPARTMENT_OTHER): Payer: Self-pay | Admitting: *Deleted

## 2023-12-11 ENCOUNTER — Encounter (HOSPITAL_BASED_OUTPATIENT_CLINIC_OR_DEPARTMENT_OTHER): Payer: Self-pay | Admitting: Family Medicine

## 2023-12-11 VITALS — BP 137/61 | HR 76 | Ht 62.0 in | Wt 192.4 lb

## 2023-12-11 DIAGNOSIS — M170 Bilateral primary osteoarthritis of knee: Secondary | ICD-10-CM | POA: Diagnosis not present

## 2023-12-11 DIAGNOSIS — I10 Essential (primary) hypertension: Secondary | ICD-10-CM | POA: Diagnosis not present

## 2023-12-11 DIAGNOSIS — E785 Hyperlipidemia, unspecified: Secondary | ICD-10-CM

## 2023-12-11 DIAGNOSIS — Z7689 Persons encountering health services in other specified circumstances: Secondary | ICD-10-CM

## 2023-12-11 DIAGNOSIS — R7303 Prediabetes: Secondary | ICD-10-CM

## 2023-12-11 NOTE — Progress Notes (Signed)
 New Patient Office Visit  Subjective:   Katrina Evans 08-04-1951 12/11/2023  Chief Complaint  Patient presents with   New Patient (Initial Visit)    Patient is here today to get established with the practice. States she has been having pain in both knees.    HPI: Katrina Evans presents today to establish care at Primary Care and Sports Medicine at [facility]. Introduced to Publishing rights manager role and practice setting.  All questions answered.   Last PCP: Milus Height PA [facility])  Last annual physical: Sept 2024  Concerns: See below    Katrina Evans is a 73 year old female here to establish care with a history of hyperlipidemia, prediabetes, hypertension, and osteoarthritis of bilateral knees.  KNEE PAIN:  Onset: gradual  Location: bilateral diffuse Duration: chronic  Patient reports chronic bilateral knee pain with stiffness that has not improved with repeated steroid joint injections by orthopedics with [place] and with Delbert Harness.  Most recent injections were in December 2024.  Patient is hesitant to consider surgical options due to missing time from work.  She states that her quality of life has been diminished due to decreased activities because of knee pain and decreased mobility.  She takes Tylenol and naproxen as needed for chronic knee pain.  Recent injury: no Weakness with weight bearing or walking: no Locking: yes Popping: no Bruising: no Swelling: yes Redness: no Paresthesias/decreased sensation: no Fevers: no   HYPERTENSION: Katrina Evans presents for the medical management of hypertension.  Patient's current hypertension medication regimen is: hydrochlorothiazide 25mg   Patient is  currently taking prescribed medications for HTN.  Patient is  regularly keeping a check on BP at home.  Adhering to low sodium diet: Yes Exercising Regularly: No due to knee pain Denies headache, dizziness, CP, SHOB, vision  changes.   BP Readings from Last 3 Encounters:  12/11/23 137/61  09/06/23 (!) 149/82  04/11/23 (!) 145/69    HYPERLIPIDEMIA: Katrina Evans presents for the medical management of hyperlipidemia.  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  She had workup for angioedema by allergist and it was determined that prescribed medications were not the cause. Patient is  currently taking prescribed medications for HLD.  Adhering to heathy diet: Yes Exercising regularly: No due to knee pain Denies myalgias.   Labs done on 10/12/24  Lab Results  Component Value Date   CHOL 182 06/29/2021   HDL 65 06/29/2021   LDLCALC 101 06/29/2021   TRIG 87 06/29/2021    IMPAIRED FASTING GLUCOSE Katrina Evans is here for medical management of impaired fasting glucose.  Patient's current IFG medication regimen is: Diet, Adhering to a diabetic diet: yes Denies polydipsia, polyphagia, polyuria.   Reports Last A1C was 5.9 in December 2024.  She states A1c has consistently been at 5.9 for several years.    The following portions of the patient's history were reviewed and updated as appropriate: past medical history, past surgical history, family history, social history, allergies, medications, and problem list.   Patient Active Problem List   Diagnosis Date Noted   Osteoarthritis of knees, bilateral 02/07/2023   Class 1 obesity due to excess calories with body mass index (BMI) of 34.0 to 34.9 in adult 04/29/2021   Prediabetes 04/29/2021   Essential hypertension 04/29/2021   Hyperlipidemia 04/29/2021  Past Medical History:  Diagnosis Date   Cancer [facility])    history of melanoma, bottom or right foot, around 2020   Edema of both lower extremities    GERD (gastroesophageal reflux disease)    High cholesterol    Hypertension    Joint pain     Obesity    Pre-diabetes    SOB (shortness of breath)    Vitamin D deficiency    Past Surgical History:  Procedure Laterality Date   BREAST BIOPSY Right    No Scar, around 2011   DILATATION & CURETTAGE/HYSTEROSCOPY WITH MYOSURE N/A 08/26/2021   Procedure: DILATATION & CURETTAGE/HYSTEROSCOPY WITH MYOSURE;  Surgeon: Steva Ready, DO;  Location: [facility];  Service: Gynecology;  Laterality: N/A;   LAPAROSCOPIC APPENDECTOMY N/A 08/17/2017   Procedure: APPENDECTOMY LAPAROSCOPIC;  Surgeon: Glenna Fellows, MD;  Location: [facility];  Service: General;  Laterality: N/A;   laser vein surgery     many years ago per pt as of 03/02/22   ROBOTIC ASSISTED LAPAROSCOPIC HYSTERECTOMY AND SALPINGECTOMY Bilateral 03/08/2022   Procedure: XI ROBOTIC ASSISTED LAPAROSCOPIC HYSTERECTOMY AND BILATERAL SALPINGO-OOPHORECTOMY.;  Surgeon: Steva Ready, DO;  [facility] ;  Service: Gynecology;  Laterality: Bilateral;   TUBAL LIGATION  1983   Family History  Problem Relation Age of Onset   Diabetes Mother    High blood pressure Mother    High Cholesterol Mother    Stroke Mother    Cancer Mother    Depression Mother    Schizophrenia Mother    Obesity Mother    High blood pressure Father    High Cholesterol Father    Heart disease Father    Cancer Father    Alcoholism Father    Breast cancer Neg Hx    Social History   Socioeconomic History   Marital status: Divorced    Spouse name: Not on file   Number of children: Not on file   Years of education: Not on file   Highest education level: Bachelor's degree (e.g., BA, AB, BS)  Occupational History   Occupation: Scientist, research (medical)  Tobacco Use   Smoking status: Former    Current packs/day: 0.00    Average packs/day: 1 pack/day for 35.0 years (35.0 ttl pk-yrs)    Types: Cigarettes    Start date: 30    Quit date: 2005    Years since quitting: 20.1   Smokeless tobacco: Never  Vaping Use    Vaping status: Never Used  Substance and Sexual Activity   Alcohol use: Yes    Comment: a drink twice a year   Drug use: No   Sexual activity: Not Currently    Birth control/protection: Surgical  Other Topics Concern   Not on file  Social History Narrative   Not on file   Social Drivers of Health   Financial Resource Strain: Medium Risk (12/07/2023)   Overall Financial Resource Strain (CARDIA)    Difficulty of Paying Living Expenses: Somewhat hard  Food Insecurity: No Food Insecurity (12/07/2023)   Hunger Vital Sign    Worried About Running Out of Food in the Last Year: Never true    Ran Out of Food in the Last Year: Never true  Transportation Needs: No Transportation Needs (12/07/2023)   PRAPARE - Administrator, Civil Service (Medical): No    Lack of Transportation (Non-Medical): No  Physical Activity: Insufficiently Active (12/07/2023)   Exercise Vital Sign    Days of Exercise per Week:  1 day    Minutes of Exercise per Session: 60 min  Stress: No Stress Concern Present (12/07/2023)   Katrina Evans of Occupational Health - Occupational Stress Questionnaire    Feeling of Stress : Only a little  Social Connections: Socially Isolated (12/07/2023)   Social Connection and Isolation Panel [NHANES]    Frequency of Communication with Friends and Family: Twice a week    Frequency of Social Gatherings with Friends and Family: Once a week    Attends Religious Services: Never    Database administrator or Organizations: No    Attends Engineer, structural: Not on file    Marital Status: Divorced  Intimate Partner Violence: Not At Risk (12/11/2023)   Humiliation, Afraid, Rape, and Kick questionnaire    Fear of Current or Ex-Partner: No    Emotionally Abused: No    Physically Abused: No    Sexually Abused: No   Outpatient Medications Prior to Visit  Medication Sig Dispense Refill   acetaminophen (TYLENOL) 500 MG tablet Take 500 mg by mouth every 6 (six) hours as  needed.     atorvastatin (LIPITOR) 10 MG tablet Take 10 mg by mouth daily.     Collagen-Vitamin C-Biotin (COLLAGEN PO) Take by mouth.     fexofenadine (ALLEGRA) 180 MG tablet Take 180 mg by mouth daily.     hydrochlorothiazide (HYDRODIURIL) 25 MG tablet TK 1 T PO QD IN THE MORNING FOR HIGH BP     naproxen sodium (ALEVE) 220 MG tablet Take 220 mg by mouth.     acetaminophen (TYLENOL) 500 MG tablet Take 2 tablets (1,000 mg total) by mouth every 6 (six) hours. 120 tablet 0   cetirizine (ZYRTEC ALLERGY) 10 MG tablet Take 10 mg by mouth daily.     diphenhydrAMINE (BENADRYL) 25 MG tablet Take 1 tablet (25 mg total) by mouth every 6 (six) hours for 5 days. 20 tablet 0   ergocalciferol (VITAMIN D2) 1.25 MG (50000 UT) capsule Take 50,000 Units by mouth once a week.     famotidine (PEPCID) 40 MG tablet Take 40 mg by mouth 2 (two) times daily.     metoprolol succinate (TOPROL XL) 25 MG 24 hr tablet Take 1 tablet (25 mg total) by mouth daily. (Patient not taking: Reported on 02/16/2023) 90 tablet 3   montelukast (SINGULAIR) 10 MG tablet Take 10 mg by mouth daily.     Multiple Vitamins-Minerals (ALIVE HAIR, SKIN & NAILS) CHEW Chew by mouth daily.     predniSONE (DELTASONE) 10 MG tablet Take 2 tablets (20 mg total) by mouth 2 (two) times daily with a meal. 20 tablet 0   No facility-administered medications prior to visit.   Allergies  Allergen Reactions   Diclofenac Nausea Only    Pt stated, "Upset my stomach." Patient states that she can take NSAIDS, just nothing too strong.   Mobic [Meloxicam] Palpitations    ROS: A complete ROS was performed with pertinent positives/negatives noted in the HPI. The remainder of the ROS are negative.   Objective:   Today's Vitals   12/11/23 1347  BP: 137/61  Pulse: 76  SpO2: 96%  Weight: 192 lb 6.4 oz (87.3 kg)  Height: 5\' 2"  (1.575 m)    GENERAL: Well-appearing, in NAD. Well nourished.  SKIN: Pink, warm and dry. No rash, lesion, ulceration, or ecchymoses.   Head: Normocephalic. NECK: Trachea midline. Full ROM w/o pain or tenderness.  RESPIRATORY: Chest wall symmetrical. Respirations even and non-labored. Breath sounds clear to  auscultation bilaterally.  CARDIAC: S1, S2 present, regular rate and rhythm without murmur or gallops. Peripheral pulses 2+ bilaterally.  MSK: Muscle tone and strength appropriate for age.  Decreased range of motion to bilateral knees due to pain. EXTREMITIES: Without clubbing, cyanosis, or edema.  NEUROLOGIC: No motor or sensory deficits. Steady, even gait. C2-C12 intact.  PSYCH/MENTAL STATUS: Alert, oriented x 3. Cooperative, appropriate mood and affect.     Assessment & Plan:  1. Encounter to establish care with new doctor (Primary) Discussed role of PCP and reviewed patient's chart with recent Medicare wellness visit in 2024 and visits with PCP.  Will obtain records from [facility], [place] primary care to update patient's health maintenance and to review her recent lab results taken in December 2024.  2. Primary osteoarthritis of both knees Discussed possible options including gel injections with Dr. De Peru here in sports medicine for longer relief of bilateral knee pain and or possible referral to orthopedics for surgical replacement if she is a candidate.  We will obtain her x-rays from [facility] and she will return in approximately 4 weeks to discuss injections with Dr. De Peru.  3. Hyperlipidemia, unspecified hyperlipidemia type Patient reports cholesterol increased significantly after stopping statin therapy.  She is currently restarting and recommend we recheck her level in approximately 3 to 4 months.  Patient agreeable and will continue to make dietary changes to lower risk.  4. Essential hypertension Well-controlled, continue current regimen and obtain lab work within 3 to 4 months with follow-up.  5. Prediabetes Well-controlled per patient currently.  Continue on dietary modifications and lifestyle  management.  Repeat in 3-4 months  Patient to reach out to office if new, worrisome, or unresolved symptoms arise or if no improvement in patient's condition. Patient verbalized understanding and is agreeable to treatment plan. All questions answered to patient's satisfaction.    Return for 2 visit- 4 weeks for knee options w/ Tommi Rumps Peru; 4 month follow up with PCP (HLD, HTN, IFG-fasting labs.    Hilbert Bible, [place]

## 2024-01-03 ENCOUNTER — Encounter (HOSPITAL_BASED_OUTPATIENT_CLINIC_OR_DEPARTMENT_OTHER): Payer: Self-pay | Admitting: Family Medicine

## 2024-01-03 ENCOUNTER — Ambulatory Visit (INDEPENDENT_AMBULATORY_CARE_PROVIDER_SITE_OTHER): Admitting: Family Medicine

## 2024-01-03 VITALS — BP 173/76 | HR 77 | Ht 63.0 in | Wt 192.0 lb

## 2024-01-03 DIAGNOSIS — M170 Bilateral primary osteoarthritis of knee: Secondary | ICD-10-CM | POA: Diagnosis not present

## 2024-01-03 NOTE — Patient Instructions (Signed)

## 2024-01-03 NOTE — Assessment & Plan Note (Signed)
 Patient presents today for evaluation of bilateral knee pain related to underlying osteoarthritis.  She has had prior evaluation with various orthopedic specialist and has received steroid injections as well as gel injections in the past.  She is not exactly sure what specific gel injections she has received previously.  Most recent injection was completed with [facility] about 2-1/2 months ago.  At that time, she received steroid injections.  Most recent injection only lasted for about a month.  She is still trying to avoid knee replacement surgery due to expected downtime that this would entail.  She is currently working and would not want to be limited by postoperative recovery. We did review available imaging in the office today, last imaging completed within our system was in 2023.  She indicates that she did have updated x-rays through [facility].  Did review office notes from [facility] today as well.  These have been scanned into the chart.  Noted to have advanced bilateral knee osteoarthritis, right greater than left.  Her symptoms have been progressive and has been affecting day-to-day activities including at work as well as recreational activities. After discussion, patient would like to arrange for viscosupplementation, discussed potential risks and benefits related to this.  We will obtain authorization through insurance and then schedule patient for procedural visits.

## 2024-01-03 NOTE — Progress Notes (Signed)
    Procedures performed today:    None.  Independent interpretation of notes and tests performed by another provider:   None.  Brief History, Exam, Impression, and Recommendations:    BP (!) 173/76 (BP Location: Right Arm, Patient Position: Sitting, Cuff Size: Normal)   Pulse 77   Ht 5\' 3"  (1.6 m)   Wt 192 lb (87.1 kg)   SpO2 95%   BMI 34.01 kg/m   Primary osteoarthritis of both knees Assessment & Plan: Patient presents today for evaluation of bilateral knee pain related to underlying osteoarthritis.  She has had prior evaluation with various orthopedic specialist and has received steroid injections as well as gel injections in the past.  She is not exactly sure what specific gel injections she has received previously.  Most recent injection was completed with Del[facility] Harness about 2-1/2 months ago.  At that time, she received steroid injections.  Most recent injection only lasted for about a month.  She is still trying to avoid knee replacement surgery due to expected downtime that this would entail.  She is currently working and would not want to be limited by postoperative recovery. We did review available imaging in the office today, last imaging completed within our system was in 2023.  She indicates that she did have updated x-rays through [facility].  Did review office notes from [facility] today as well.  These have been scanned into the chart.  Noted to have advanced bilateral knee osteoarthritis, right greater than left.  Her symptoms have been progressive and has been affecting day-to-day activities including at work as well as recreational activities. After discussion, patient would like to arrange for viscosupplementation, discussed potential risks and benefits related to this.  We will obtain authorization through insurance and then schedule patient for procedural visits.   Return if symptoms worsen or fail to  improve.   ___________________________________________ Milen Lengacher de Peru, MD, ABFM, [facility] Primary Care and Sports Medicine [facility]

## 2024-01-08 DIAGNOSIS — M25562 Pain in left knee: Secondary | ICD-10-CM | POA: Diagnosis not present

## 2024-01-08 DIAGNOSIS — M25561 Pain in right knee: Secondary | ICD-10-CM | POA: Diagnosis not present

## 2024-01-08 NOTE — Telephone Encounter (Signed)
 Dr. De Peru, please see phone encounter and also see mychart message that was sent by pt and advise on this what you recommend.

## 2024-01-08 NOTE — Telephone Encounter (Signed)
 Copied from CRM [id number]. Topic: Clinical - Medication Question >> Jan 08, 2024 11:01 AM Katrina Evans wrote: Reason for CRM: The patient saw Dr.De Peru on 3/12 and wants to get an update on some gel shots for both of her knees. The patient can be contacted by phone at [phone].

## 2024-01-10 ENCOUNTER — Encounter (HOSPITAL_BASED_OUTPATIENT_CLINIC_OR_DEPARTMENT_OTHER): Payer: Self-pay | Admitting: Family Medicine

## 2024-01-15 ENCOUNTER — Telehealth (HOSPITAL_BASED_OUTPATIENT_CLINIC_OR_DEPARTMENT_OTHER): Payer: Self-pay | Admitting: *Deleted

## 2024-01-15 DIAGNOSIS — M25562 Pain in left knee: Secondary | ICD-10-CM | POA: Diagnosis not present

## 2024-01-15 DIAGNOSIS — M25561 Pain in right knee: Secondary | ICD-10-CM | POA: Diagnosis not present

## 2024-01-15 NOTE — Telephone Encounter (Signed)
 Copied from CRM [id number]. Topic: General - Other >> Jan 15, 2024 10:25 AM Hillary B wrote: Reason for CRM: [employer] representative was calling on behalf of the patient regarding the pre authorization that was sent. [employer] wanted to confirm for the practice that they have not received the pre authorization so it could be still under review. If need be, they did provide me with a contact number at [phone].

## 2024-01-15 NOTE — Telephone Encounter (Signed)
 Can you please advise on the injection prior auth

## 2024-01-18 ENCOUNTER — Telehealth (HOSPITAL_BASED_OUTPATIENT_CLINIC_OR_DEPARTMENT_OTHER): Payer: Self-pay | Admitting: Family Medicine

## 2024-01-18 NOTE — Telephone Encounter (Signed)
 Got approval on gel shots due to schedules being full first available to get in for weekly injections is 4/17 patient is going out of town 4/16 would like to know since the gel injections can't be started before trip could she do cortisone shots before trip and wait to do gel. Needs some relief as this is a walking trip  Please advise or see schedule and recommend alternative dates

## 2024-01-18 NOTE — Telephone Encounter (Signed)
 Spoke with patient she went ahead and scheduled gel shots after trip starting 4/21

## 2024-01-18 NOTE — Telephone Encounter (Signed)
 Patient states she is waiting for her insurance to approve a gel injection for her knees and wanted to see if there is anything else she can do.  She will continue to call the insurance company and was requesting cortisone shots. She wanted a message sent back to you.

## 2024-01-22 DIAGNOSIS — M25562 Pain in left knee: Secondary | ICD-10-CM | POA: Diagnosis not present

## 2024-01-22 DIAGNOSIS — M25561 Pain in right knee: Secondary | ICD-10-CM | POA: Diagnosis not present

## 2024-01-23 ENCOUNTER — Ambulatory Visit (HOSPITAL_BASED_OUTPATIENT_CLINIC_OR_DEPARTMENT_OTHER): Payer: Medicare HMO | Admitting: Family Medicine

## 2024-01-29 DIAGNOSIS — M25561 Pain in right knee: Secondary | ICD-10-CM | POA: Diagnosis not present

## 2024-01-29 DIAGNOSIS — M25562 Pain in left knee: Secondary | ICD-10-CM | POA: Diagnosis not present

## 2024-02-07 ENCOUNTER — Encounter (HOSPITAL_BASED_OUTPATIENT_CLINIC_OR_DEPARTMENT_OTHER): Payer: Self-pay | Admitting: Family Medicine

## 2024-02-07 ENCOUNTER — Telehealth (HOSPITAL_BASED_OUTPATIENT_CLINIC_OR_DEPARTMENT_OTHER): Payer: Self-pay | Admitting: *Deleted

## 2024-02-07 NOTE — Telephone Encounter (Signed)
 Copied from CRM [id number]. Topic: General - Other >> Feb 07, 2024  2:09 PM [name] wrote: Reason for CRM: Katrina Evans with [facility] was following up on their fax from 4/8. Their call back number is [phone] if needed.

## 2024-02-07 NOTE — Telephone Encounter (Signed)
 Paperwork was received but was sent to scan without being signed for the last page. Will give this to [facility] for her to sign and then will fax after receiving it back.

## 2024-02-07 NOTE — Telephone Encounter (Signed)
 noted

## 2024-02-12 ENCOUNTER — Ambulatory Visit (HOSPITAL_BASED_OUTPATIENT_CLINIC_OR_DEPARTMENT_OTHER): Admitting: Family Medicine

## 2024-02-19 ENCOUNTER — Ambulatory Visit (HOSPITAL_BASED_OUTPATIENT_CLINIC_OR_DEPARTMENT_OTHER): Admitting: Family Medicine

## 2024-02-26 ENCOUNTER — Ambulatory Visit (HOSPITAL_BASED_OUTPATIENT_CLINIC_OR_DEPARTMENT_OTHER): Admitting: Family Medicine

## 2024-03-04 ENCOUNTER — Other Ambulatory Visit: Payer: Self-pay | Admitting: Physician Assistant

## 2024-03-04 DIAGNOSIS — Z1231 Encounter for screening mammogram for malignant neoplasm of breast: Secondary | ICD-10-CM

## 2024-04-18 ENCOUNTER — Ambulatory Visit

## 2024-04-19 ENCOUNTER — Ambulatory Visit

## 2024-04-19 ENCOUNTER — Ambulatory Visit
Admission: RE | Admit: 2024-04-19 | Discharge: 2024-04-19 | Disposition: A | Discharge status: Home / Self Care | Source: Ambulatory Visit | Attending: Physician Assistant | Admitting: Physician Assistant

## 2024-04-19 DIAGNOSIS — Z1231 Encounter for screening mammogram for malignant neoplasm of breast: Secondary | ICD-10-CM

## 2024-04-22 ENCOUNTER — Ambulatory Visit (HOSPITAL_BASED_OUTPATIENT_CLINIC_OR_DEPARTMENT_OTHER): Payer: Medicare HMO | Admitting: Family Medicine

## 2024-07-17 ENCOUNTER — Encounter (HOSPITAL_BASED_OUTPATIENT_CLINIC_OR_DEPARTMENT_OTHER): Admitting: Family Medicine

## 2024-07-23 ENCOUNTER — Ambulatory Visit (HOSPITAL_BASED_OUTPATIENT_CLINIC_OR_DEPARTMENT_OTHER)

## 2024-07-23 ENCOUNTER — Telehealth: Payer: Self-pay

## 2024-07-23 NOTE — Telephone Encounter (Signed)
 Copied from CRM [id number]. Topic: General - Other >> Jul 23, 2024  9:39 AM Tysheama G wrote: Reason for CRM: Patient no longer sees a doctor at this location and received a call to schedule Medicare Annual Wellness Visit. Can we please take her off the [facility] list. Thank you

## 2024-10-02 ENCOUNTER — Other Ambulatory Visit: Payer: Self-pay | Admitting: Physician Assistant

## 2024-10-02 DIAGNOSIS — Z803 Family history of malignant neoplasm of breast: Secondary | ICD-10-CM | POA: Diagnosis not present

## 2024-10-02 DIAGNOSIS — N6310 Unspecified lump in the right breast, unspecified quadrant: Secondary | ICD-10-CM

## 2024-10-02 DIAGNOSIS — Z85820 Personal history of malignant melanoma of skin: Secondary | ICD-10-CM | POA: Diagnosis not present

## 2024-10-02 DIAGNOSIS — Z9889 Other specified postprocedural states: Secondary | ICD-10-CM | POA: Diagnosis not present

## 2024-10-07 ENCOUNTER — Inpatient Hospital Stay
Admission: RE | Admit: 2024-10-07 | Discharge: 2024-10-07 | Attending: Physician Assistant | Admitting: Physician Assistant

## 2024-10-07 DIAGNOSIS — N6310 Unspecified lump in the right breast, unspecified quadrant: Secondary | ICD-10-CM

## 2024-11-04 ENCOUNTER — Encounter: Payer: Self-pay | Admitting: Podiatry

## 2024-11-04 ENCOUNTER — Ambulatory Visit: Admitting: Podiatry

## 2024-11-04 DIAGNOSIS — L600 Ingrowing nail: Secondary | ICD-10-CM | POA: Diagnosis not present

## 2024-11-04 NOTE — Patient Instructions (Signed)

## 2024-11-04 NOTE — Progress Notes (Signed)
 Subjective:   Patient ID: Katrina Evans, female   DOB: 74 y.o.   MRN: [id number]   HPI Patient presents stating she has had a lot of pain with her big toe right foot and has been going on for several months she has tried to soak it and trim it without relief of symptoms and had a pedicure.  Patient does not smoke likes to be active   Review of Systems  All other systems reviewed and are negative.       Objective:  Physical Exam Vitals and nursing note reviewed.  Constitutional:      Appearance: She is well-developed.  Pulmonary:     Effort: Pulmonary effort is normal.  Musculoskeletal:        General: Normal range of motion.  Skin:    General: Skin is warm.  Neurological:     Mental Status: She is alert.     Neurovascular status intact muscle strength adequate range of motion adequate incurvated medial border right big toe painful when pressed inability to wear shoe gear without pain in the area.  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  Discussed at great length discussed previous fascial inflammation that appears to be improved and recommended ingrown toenail correction.  Allowed her to read consent form understanding risk of surgery and today I infiltrated the right big toe 60 mg like Marcaine  mixture sterile prep done using sterile instrumentation removed the border exposed matrix applied phenol 3 applications 30 seconds followed by alcohol lavage sterile dressing gave instructions on soaks wear dressing 24 hours take it off earlier if throbbing were to occur and call with any concerns or questions
